# Patient Record
Sex: Female | Born: 1955
Health system: Southern US, Community
[De-identification: ages and names within clinical notes are randomized; demographics above are authoritative.]

## PROBLEM LIST (undated history)

## (undated) DIAGNOSIS — E7800 Pure hypercholesterolemia, unspecified: Secondary | ICD-10-CM

## (undated) DIAGNOSIS — R079 Chest pain, unspecified: Secondary | ICD-10-CM

## (undated) DIAGNOSIS — B019 Varicella without complication: Secondary | ICD-10-CM

## (undated) DIAGNOSIS — R002 Palpitations: Secondary | ICD-10-CM

## (undated) DIAGNOSIS — I10 Essential (primary) hypertension: Secondary | ICD-10-CM

## (undated) DIAGNOSIS — Z8619 Personal history of other infectious and parasitic diseases: Secondary | ICD-10-CM

## (undated) DIAGNOSIS — R51 Headache: Secondary | ICD-10-CM

## (undated) DIAGNOSIS — K219 Gastro-esophageal reflux disease without esophagitis: Secondary | ICD-10-CM

## (undated) DIAGNOSIS — R011 Cardiac murmur, unspecified: Secondary | ICD-10-CM

## (undated) HISTORY — DX: Personal history of other infectious and parasitic diseases: Z86.19

## (undated) HISTORY — DX: Gastro-esophageal reflux disease without esophagitis: K21.9

## (undated) HISTORY — DX: Headache: R51

## (undated) HISTORY — DX: Palpitations: R00.2

## (undated) HISTORY — DX: Varicella without complication: B01.9

## (undated) HISTORY — DX: Chest pain, unspecified: R07.9

## (undated) HISTORY — DX: Pure hypercholesterolemia, unspecified: E78.00

## (undated) HISTORY — PX: BREAST EXCISIONAL BIOPSY: SUR124

## (undated) HISTORY — DX: Cardiac murmur, unspecified: R01.1

## (undated) HISTORY — DX: Essential (primary) hypertension: I10

## (undated) HISTORY — PX: BIOPSY BREAST: PRO8

## (undated) HISTORY — PX: COLONOSCOPY: SHX174

---

## 2009-11-10 LAB — HM COLONOSCOPY: HM Colonoscopy: NORMAL

## 2012-06-08 LAB — HM PAP SMEAR: HM Pap smear: NORMAL

## 2012-08-21 ENCOUNTER — Ambulatory Visit (INDEPENDENT_AMBULATORY_CARE_PROVIDER_SITE_OTHER): Payer: BC Managed Care – PPO | Admitting: Internal Medicine

## 2012-08-21 ENCOUNTER — Encounter: Payer: Self-pay | Admitting: Internal Medicine

## 2012-08-21 VITALS — BP 142/90 | HR 60 | Temp 98.4°F | Ht 62.5 in | Wt 135.0 lb

## 2012-08-21 DIAGNOSIS — E785 Hyperlipidemia, unspecified: Secondary | ICD-10-CM

## 2012-08-21 DIAGNOSIS — I059 Rheumatic mitral valve disease, unspecified: Secondary | ICD-10-CM

## 2012-08-21 DIAGNOSIS — R519 Headache, unspecified: Secondary | ICD-10-CM | POA: Insufficient documentation

## 2012-08-21 DIAGNOSIS — R51 Headache: Secondary | ICD-10-CM

## 2012-08-21 DIAGNOSIS — I10 Essential (primary) hypertension: Secondary | ICD-10-CM

## 2012-08-21 DIAGNOSIS — Z8632 Personal history of gestational diabetes: Secondary | ICD-10-CM

## 2012-08-21 DIAGNOSIS — M542 Cervicalgia: Secondary | ICD-10-CM

## 2012-08-21 DIAGNOSIS — Z23 Encounter for immunization: Secondary | ICD-10-CM

## 2012-08-21 DIAGNOSIS — I341 Nonrheumatic mitral (valve) prolapse: Secondary | ICD-10-CM | POA: Insufficient documentation

## 2012-08-21 LAB — HEPATIC FUNCTION PANEL
ALT: 25 U/L (ref 0–35)
AST: 23 U/L (ref 0–37)
Albumin: 4.3 g/dL (ref 3.5–5.2)
Alkaline Phosphatase: 59 U/L (ref 39–117)

## 2012-08-21 LAB — LIPID PANEL
Cholesterol: 206 mg/dL — ABNORMAL HIGH (ref 0–200)
Total CHOL/HDL Ratio: 4
VLDL: 22.4 mg/dL (ref 0.0–40.0)

## 2012-08-21 LAB — BASIC METABOLIC PANEL
Calcium: 9.8 mg/dL (ref 8.4–10.5)
GFR: 94.5 mL/min (ref >60.00)
Potassium: 4.9 mEq/L (ref 3.5–5.1)
Sodium: 141 mEq/L (ref 135–145)

## 2012-08-21 LAB — CBC WITH DIFFERENTIAL/PLATELET
Eosinophils Relative: 1.8 % (ref 0.0–5.0)
HCT: 43.9 % (ref 36.0–46.0)
Hemoglobin: 14.4 g/dL (ref 12.0–15.0)
Lymphocytes Relative: 31 % (ref 12.0–46.0)
Lymphs Abs: 2 10*3/uL (ref 0.7–4.0)
Monocytes Relative: 5.8 % (ref 3.0–12.0)
Neutro Abs: 4 10*3/uL (ref 1.4–7.7)
RBC: 5.03 Mil/uL (ref 3.87–5.11)
WBC: 6.5 10*3/uL (ref 4.5–10.5)

## 2012-08-21 LAB — T4, FREE: Free T4: 0.77 ng/dL (ref 0.60–1.60)

## 2012-08-21 LAB — LDL CHOLESTEROL, DIRECT: Direct LDL: 145.4 mg/dL

## 2012-08-21 LAB — HEMOGLOBIN A1C: Hgb A1c MFr Bld: 6 % (ref 4.6–6.5)
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
## 2012-08-21 NOTE — Patient Instructions (Signed)
Will notify you  of labs when available.  Continue lifestyle intervention healthy eating and exercise . Flu vaccine today . Plan fu depdending on labs or preventive visit in 6 months .

## 2012-08-24 NOTE — Progress Notes (Signed)
Quick Note:  Pt informed on home personally identified VM ______ 

## 2012-12-06 ENCOUNTER — Ambulatory Visit: Payer: BC Managed Care – PPO | Admitting: Internal Medicine

## 2012-12-08 ENCOUNTER — Ambulatory Visit (INDEPENDENT_AMBULATORY_CARE_PROVIDER_SITE_OTHER): Payer: BC Managed Care – PPO | Admitting: Internal Medicine

## 2012-12-08 ENCOUNTER — Encounter: Payer: Self-pay | Admitting: Internal Medicine

## 2012-12-08 VITALS — BP 138/80 | HR 70 | Temp 98.4°F | Resp 18 | Wt 136.0 lb

## 2012-12-08 DIAGNOSIS — M549 Dorsalgia, unspecified: Secondary | ICD-10-CM

## 2012-12-08 LAB — POCT URINALYSIS DIP (MANUAL ENTRY)
Glucose, UA: NEGATIVE
Spec Grav, UA: 1.01
Urobilinogen, UA: 0.2
pH, UA: 6

## 2012-12-08 MED ORDER — METHOCARBAMOL 750 MG PO TABS: ORAL_TABLET | ORAL | Status: DC

## 2012-12-08 MED ORDER — NAPROXEN 500 MG PO TABS: 500.0000 mg | ORAL_TABLET | Freq: Two times a day (BID) | ORAL | Status: DC

## 2012-12-08 NOTE — Progress Notes (Signed)
Chief Complaint  Patient presents with  . Back Pain    HPI: Patient comes in today for SDA for  new problem evaluation. 2 weeks of back pain insidious onset  and went to chiro and  Temporarily helpul   .  Hx of same  off  But never lasted this long.  Pain can be 7/10 Worse with : ? Lifting    Avoiding lifting.  Heat helps.   Tried aleve the other niught ? Some help.  Worse with bending. Location lower back buttocks area without radiation to leg or weakness.  Has been driving and sitting a lot recently not a lot of exericse.  No bowel bladder issues and no fever. ROS: See pertinent positives and negatives per HPI.  Past Medical History  Diagnosis Date  . Chicken pox   . Headache   . Murmur, heart     MVP by hx   . BP (high blood pressure)   . High cholesterol   . Hx of varicella     Family History  Problem Relation Age of Onset  . Diabetes Mother   . Hypertension Mother   . Stroke Father   . Heart disease Paternal Grandmother     History   Social History  . Marital Status: Married    Spouse Name: N/A    Number of Children: N/A  . Years of Education: N/A   Social History Main Topics  . Smoking status: Never Smoker   . Smokeless tobacco: None  . Alcohol Use: No  . Drug Use: None  . Sexually Active: None   Other Topics Concern  . None   Social History Narrative   7 hours of sleep per night2 people living in the homeConsiders her health goodBA  Degree husband  Is minister No petsG3 P3 Neg ets  Net tad  Neg FAWalking q d     Outpatient Encounter Prescriptions as of 12/08/2012  Medication Sig Dispense Refill  . amLODipine (NORVASC) 2.5 MG tablet Take 2.5 mg by mouth daily.      . Cholecalciferol (VITAMIN D3) 2000 UNITS TABS Take 1 tablet by mouth daily.      . Evening Primrose Oil 1000 MG CAPS Take 1 capsule by mouth daily.      . nadolol (CORGARD) 20 MG tablet Take 10 mg by mouth daily.      . methocarbamol (ROBAXIN-750) 750 MG tablet Can use HS or tid prn muscle  spasm  24 tablet  0  . naproxen (NAPROSYN) 500 MG tablet Take 1 tablet (500 mg total) by mouth 2 (two) times daily with a meal.  30 tablet  1  . [DISCONTINUED] esomeprazole (NEXIUM) 40 MG capsule Take 40 mg by mouth daily before breakfast.        EXAM:  BP 138/80  Pulse 70  Temp 98.4 F (36.9 C) (Oral)  Resp 18  Wt 136 lb (61.689 kg)  SpO2 94%  There is no height on file to calculate BMI.  GENERAL: vitals reviewed and listed above, alert, oriented, appears well hydrated and in no acute distress  HEENT: atraumatic, conjunctiva  clear, no obvious abnormalities on inspection of external nose and ears  NECK: no obvious masses on inspection palpation  Abdomen:  Sof,t normal bowel sounds without hepatosplenomegaly, no guarding rebound or masses no CVA tenderness CV: HRRR, no clubbing cyanosis or  peripheral edema nl cap refill  MS: moves all extremities without noticeable focal  Abnormality Back no scoliosis no point tenderness midline  points to upper SI areas  And such neg slr toe heel walk nl  Sitting more uncomfortable .  PSYCH: pleasant and cooperative, no obvious depression or anxiety UA  Basically normal trc bl insig  ASSESSMENT AND PLAN:  Discussed the following assessment and plan:  1. Back pain low  POCT urinalysis dipstick   low insidious prob mechanichal bit a bit atypical will follow    Risk benefit of medication discussed. nsaid for 7- 10 days  Back hygiene and fu if  persistent or progressive . after 2 weeks or  If worse.avoid prolonged sitting  -Patient advised to return or notify health care team  if symptoms worsen or persist or new concerns arise.  Patient Instructions  This seems like mechanical back pain    And poss sciatica pain. Sometimes sitting loing times and lifting can aggravate this.   Add  Antiinflammatory  For 7 -10 days  . Can use mild muscle relaxant at night if needed.    I f not getting better in the next 2 weeks contact us for FU  .      Back Pain, Adult Low back pain is very common. About 1 in 5 people have back pain.The cause of low back pain is rarely dangerous. The pain often gets better over time.About half of people with a sudden onset of back pain feel better in just 2 weeks. About 8 in 10 people feel better by 6 weeks.  CAUSES Some common causes of back pain include:  Strain of the muscles or ligaments supporting the spine.  Wear and tear (degeneration) of the spinal discs.  Arthritis.  Direct injury to the back. DIAGNOSIS Most of the time, the direct cause of low back pain is not known.However, back pain can be treated effectively even when the exact cause of the pain is unknown.Answering your caregiver's questions about your overall health and symptoms is one of the most accurate ways to make sure the cause of your pain is not dangerous. If your caregiver needs more information, he or she may order lab work or imaging tests (X-rays or MRIs).However, even if imaging tests show changes in your back, this usually does not require surgery. HOME CARE INSTRUCTIONS For many people, back pain returns.Since low back pain is rarely dangerous, it is often a condition that people can learn to [facility] their own.   Remain active. It is stressful on the back to sit or stand in one place. Do not sit, drive, or stand in one place for more than 30 minutes at a time. Take short walks on level surfaces as soon as pain allows.Try to increase the length of time you walk each day.  Do not stay in bed.Resting more than 1 or 2 days can delay your recovery.  Do not avoid exercise or work.Your body is made to move.It is not dangerous to be active, even though your back may hurt.Your back will likely heal faster if you return to being active before your pain is gone.  Pay attention to your body when you bend and lift. Many people have less discomfortwhen lifting if they bend their knees, keep the load close to their  bodies,and avoid twisting. Often, the most comfortable positions are those that put less stress on your recovering back.  Find a comfortable position to sleep. Use a firm mattress and lie on your side with your knees slightly bent. If you lie on your back, put a pillow under your knees.  Only take  over-the-counter or prescription medicines as directed by your caregiver. Over-the-counter medicines to reduce pain and inflammation are often the most helpful.Your caregiver may prescribe muscle relaxant drugs.These medicines help dull your pain so you can more quickly return to your normal activities and healthy exercise.  Put ice on the injured area.  Put ice in a plastic bag.  Place a towel between your skin and the bag.  Leave the ice on for 15 to 20 minutes, 3 to 4 times a day for the first 2 to 3 days. After that, ice and heat may be alternated to reduce pain and spasms.  Ask your caregiver about trying back exercises and gentle massage. This may be of some benefit.  Avoid feeling anxious or stressed.Stress increases muscle tension and can worsen back pain.It is important to recognize when you are anxious or stressed and learn ways to manage it.Exercise is a great option. SEEK MEDICAL CARE IF:  You have pain that is not relieved with rest or medicine.  You have pain that does not improve in 1 week.  You have new symptoms.  You are generally not feeling well. SEEK IMMEDIATE MEDICAL CARE IF:   You have pain that radiates from your back into your legs.  You develop new bowel or bladder control problems.  You have unusual weakness or numbness in your arms or legs.  You develop nausea or vomiting.  You develop abdominal pain.  You feel faint. Document Released: 10/25/2005 Document Revised: 04/25/2012 Document Reviewed: 03/15/2011 [facility] Patient Information 2013 [place], [place].  Back Exercises These exercises may help you when beginning to rehabilitate your injury. Your  symptoms may resolve with or without further involvement from your physician, physical therapist or athletic trainer. While completing these exercises, remember:   Restoring tissue flexibility helps normal motion to return to the joints. This allows healthier, less painful movement and activity.  An effective stretch should be held for at least 30 seconds.  A stretch should never be painful. You should only feel a gentle lengthening or release in the stretched tissue. STRETCH  Extension, Prone on Elbows   Lie on your stomach on the floor, a bed will be too soft. Place your palms about shoulder width apart and at the height of your head.  Place your elbows under your shoulders. If this is too painful, stack pillows under your chest.  Allow your body to relax so that your hips drop lower and make contact more completely with the floor.  Hold this position for __________ seconds.  Slowly return to lying flat on the floor. Repeat __________ times. Complete this exercise __________ times per day.  RANGE OF MOTION  Extension, Prone Press Ups   Lie on your stomach on the floor, a bed will be too soft. Place your palms about shoulder width apart and at the height of your head.  Keeping your back as relaxed as possible, slowly straighten your elbows while keeping your hips on the floor. You may adjust the placement of your hands to maximize your comfort. As you gain motion, your hands will come more underneath your shoulders.  Hold this position __________ seconds.  Slowly return to lying flat on the floor. Repeat __________ times. Complete this exercise __________ times per day.  RANGE OF MOTION- Quadruped, Neutral Spine   Assume a hands and knees position on a firm surface. Keep your hands under your shoulders and your knees under your hips. You may place padding under your knees for comfort.  Drop  your head and point your tail bone toward the ground below you. This will round out your low  back like an angry cat. Hold this position for __________ seconds.  Slowly lift your head and release your tail bone so that your back sags into a large arch, like an old horse.  Hold this position for __________ seconds.  Repeat this until you feel limber in your low back.  Now, find your "sweet spot." This will be the most comfortable position somewhere between the two previous positions. This is your neutral spine. Once you have found this position, tense your stomach muscles to support your low back.  Hold this position for __________ seconds. Repeat __________ times. Complete this exercise __________ times per day.  STRETCH  Flexion, Single Knee to Chest   Lie on a firm bed or floor with both legs extended in front of you.  Keeping one leg in contact with the floor, bring your opposite knee to your chest. Hold your leg in place by either grabbing behind your thigh or at your knee.  Pull until you feel a gentle stretch in your low back. Hold __________ seconds.  Slowly release your grasp and repeat the exercise with the opposite side. Repeat __________ times. Complete this exercise __________ times per day.  STRETCH - Hamstrings, Standing  Stand or sit and extend your right / left leg, placing your foot on a chair or foot stool  Keeping a slight arch in your low back and your hips straight forward.  Lead with your chest and lean forward at the waist until you feel a gentle stretch in the back of your right / left knee or thigh. (When done correctly, this exercise requires leaning only a small distance.)  Hold this position for __________ seconds. Repeat __________ times. Complete this stretch __________ times per day. STRENGTHENING  Deep Abdominals, Pelvic Tilt   Lie on a firm bed or floor. Keeping your legs in front of you, bend your knees so they are both pointed toward the ceiling and your feet are flat on the floor.  Tense your lower abdominal muscles to press your low  back into the floor. This motion will rotate your pelvis so that your tail bone is scooping upwards rather than pointing at your feet or into the floor.  With a gentle tension and even breathing, hold this position for __________ seconds. Repeat __________ times. Complete this exercise __________ times per day.  STRENGTHENING  Abdominals, Crunches   Lie on a firm bed or floor. Keeping your legs in front of you, bend your knees so they are both pointed toward the ceiling and your feet are flat on the floor. Cross your arms over your chest.  Slightly tip your chin down without bending your neck.  Tense your abdominals and slowly lift your trunk high enough to just clear your shoulder blades. Lifting higher can put excessive stress on the low back and does not further strengthen your abdominal muscles.  Control your return to the starting position. Repeat __________ times. Complete this exercise __________ times per day.  STRENGTHENING  Quadruped, Opposite UE/LE Lift   Assume a hands and knees position on a firm surface. Keep your hands under your shoulders and your knees under your hips. You may place padding under your knees for comfort.  Find your neutral spine and gently tense your abdominal muscles so that you can maintain this position. Your shoulders and hips should form a rectangle that is parallel with the  floor and is not twisted.  Keeping your trunk steady, lift your right hand no higher than your shoulder and then your left leg no higher than your hip. Make sure you are not holding your breath. Hold this position __________ seconds.  Continuing to keep your abdominal muscles tense and your back steady, slowly return to your starting position. Repeat with the opposite arm and leg. Repeat __________ times. Complete this exercise __________ times per day. Document Released: 11/12/2005 Document Revised: 01/17/2012 Document Reviewed: 02/06/2009 [facility] Patient Information 2013  [place], [place].       Neta Mends. Beryle Bagsby M.D.

## 2012-12-08 NOTE — Patient Instructions (Addendum)
This seems like mechanical back pain    And poss sciatica pain. Sometimes sitting loing times and lifting can aggravate this.   Add  Antiinflammatory  For 7 -10 days  . Can use mild muscle relaxant at night if needed.    I f not getting better in the next 2 weeks contact us for FU .      Back Pain, Adult Low back pain is very common. About 1 in 5 people have back pain.The cause of low back pain is rarely dangerous. The pain often gets better over time.About half of people with a sudden onset of back pain feel better in just 2 weeks. About 8 in 10 people feel better by 6 weeks.  CAUSES Some common causes of back pain include:  Strain of the muscles or ligaments supporting the spine.  Wear and tear (degeneration) of the spinal discs.  Arthritis.  Direct injury to the back. DIAGNOSIS Most of the time, the direct cause of low back pain is not known.However, back pain can be treated effectively even when the exact cause of the pain is unknown.Answering your caregiver's questions about your overall health and symptoms is one of the most accurate ways to make sure the cause of your pain is not dangerous. If your caregiver needs more information, he or she may order lab work or imaging tests (X-rays or MRIs).However, even if imaging tests show changes in your back, this usually does not require surgery. HOME CARE INSTRUCTIONS For many people, back pain returns.Since low back pain is rarely dangerous, it is often a condition that people can learn to [facility] their own.   Remain active. It is stressful on the back to sit or stand in one place. Do not sit, drive, or stand in one place for more than 30 minutes at a time. Take short walks on level surfaces as soon as pain allows.Try to increase the length of time you walk each day.  Do not stay in bed.Resting more than 1 or 2 days can delay your recovery.  Do not avoid exercise or work.Your body is made to move.It is not dangerous to be  active, even though your back may hurt.Your back will likely heal faster if you return to being active before your pain is gone.  Pay attention to your body when you bend and lift. Many people have less discomfortwhen lifting if they bend their knees, keep the load close to their bodies,and avoid twisting. Often, the most comfortable positions are those that put less stress on your recovering back.  Find a comfortable position to sleep. Use a firm mattress and lie on your side with your knees slightly bent. If you lie on your back, put a pillow under your knees.  Only take over-the-counter or prescription medicines as directed by your caregiver. Over-the-counter medicines to reduce pain and inflammation are often the most helpful.Your caregiver may prescribe muscle relaxant drugs.These medicines help dull your pain so you can more quickly return to your normal activities and healthy exercise.  Put ice on the injured area.  Put ice in a plastic bag.  Place a towel between your skin and the bag.  Leave the ice on for 15 to 20 minutes, 3 to 4 times a day for the first 2 to 3 days. After that, ice and heat may be alternated to reduce pain and spasms.  Ask your caregiver about trying back exercises and gentle massage. This may be of some benefit.  Avoid feeling anxious  or stressed.Stress increases muscle tension and can worsen back pain.It is important to recognize when you are anxious or stressed and learn ways to manage it.Exercise is a great option. SEEK MEDICAL CARE IF:  You have pain that is not relieved with rest or medicine.  You have pain that does not improve in 1 week.  You have new symptoms.  You are generally not feeling well. SEEK IMMEDIATE MEDICAL CARE IF:   You have pain that radiates from your back into your legs.  You develop new bowel or bladder control problems.  You have unusual weakness or numbness in your arms or legs.  You develop nausea or  vomiting.  You develop abdominal pain.  You feel faint. Document Released: 10/25/2005 Document Revised: 04/25/2012 Document Reviewed: 03/15/2011 [facility] Patient Information 2013 [place], [place].  Back Exercises These exercises may help you when beginning to rehabilitate your injury. Your symptoms may resolve with or without further involvement from your physician, physical therapist or athletic trainer. While completing these exercises, remember:   Restoring tissue flexibility helps normal motion to return to the joints. This allows healthier, less painful movement and activity.  An effective stretch should be held for at least 30 seconds.  A stretch should never be painful. You should only feel a gentle lengthening or release in the stretched tissue. STRETCH  Extension, Prone on Elbows   Lie on your stomach on the floor, a bed will be too soft. Place your palms about shoulder width apart and at the height of your head.  Place your elbows under your shoulders. If this is too painful, stack pillows under your chest.  Allow your body to relax so that your hips drop lower and make contact more completely with the floor.  Hold this position for __________ seconds.  Slowly return to lying flat on the floor. Repeat __________ times. Complete this exercise __________ times per day.  RANGE OF MOTION  Extension, Prone Press Ups   Lie on your stomach on the floor, a bed will be too soft. Place your palms about shoulder width apart and at the height of your head.  Keeping your back as relaxed as possible, slowly straighten your elbows while keeping your hips on the floor. You may adjust the placement of your hands to maximize your comfort. As you gain motion, your hands will come more underneath your shoulders.  Hold this position __________ seconds.  Slowly return to lying flat on the floor. Repeat __________ times. Complete this exercise __________ times per day.  RANGE OF MOTION-  Quadruped, Neutral Spine   Assume a hands and knees position on a firm surface. Keep your hands under your shoulders and your knees under your hips. You may place padding under your knees for comfort.  Drop your head and point your tail bone toward the ground below you. This will round out your low back like an angry cat. Hold this position for __________ seconds.  Slowly lift your head and release your tail bone so that your back sags into a large arch, like an old horse.  Hold this position for __________ seconds.  Repeat this until you feel limber in your low back.  Now, find your "sweet spot." This will be the most comfortable position somewhere between the two previous positions. This is your neutral spine. Once you have found this position, tense your stomach muscles to support your low back.  Hold this position for __________ seconds. Repeat __________ times. Complete this exercise __________ times per  day.  STRETCH  Flexion, Single Knee to Chest   Lie on a firm bed or floor with both legs extended in front of you.  Keeping one leg in contact with the floor, bring your opposite knee to your chest. Hold your leg in place by either grabbing behind your thigh or at your knee.  Pull until you feel a gentle stretch in your low back. Hold __________ seconds.  Slowly release your grasp and repeat the exercise with the opposite side. Repeat __________ times. Complete this exercise __________ times per day.  STRETCH - Hamstrings, Standing  Stand or sit and extend your right / left leg, placing your foot on a chair or foot stool  Keeping a slight arch in your low back and your hips straight forward.  Lead with your chest and lean forward at the waist until you feel a gentle stretch in the back of your right / left knee or thigh. (When done correctly, this exercise requires leaning only a small distance.)  Hold this position for __________ seconds. Repeat __________ times. Complete  this stretch __________ times per day. STRENGTHENING  Deep Abdominals, Pelvic Tilt   Lie on a firm bed or floor. Keeping your legs in front of you, bend your knees so they are both pointed toward the ceiling and your feet are flat on the floor.  Tense your lower abdominal muscles to press your low back into the floor. This motion will rotate your pelvis so that your tail bone is scooping upwards rather than pointing at your feet or into the floor.  With a gentle tension and even breathing, hold this position for __________ seconds. Repeat __________ times. Complete this exercise __________ times per day.  STRENGTHENING  Abdominals, Crunches   Lie on a firm bed or floor. Keeping your legs in front of you, bend your knees so they are both pointed toward the ceiling and your feet are flat on the floor. Cross your arms over your chest.  Slightly tip your chin down without bending your neck.  Tense your abdominals and slowly lift your trunk high enough to just clear your shoulder blades. Lifting higher can put excessive stress on the low back and does not further strengthen your abdominal muscles.  Control your return to the starting position. Repeat __________ times. Complete this exercise __________ times per day.  STRENGTHENING  Quadruped, Opposite UE/LE Lift   Assume a hands and knees position on a firm surface. Keep your hands under your shoulders and your knees under your hips. You may place padding under your knees for comfort.  Find your neutral spine and gently tense your abdominal muscles so that you can maintain this position. Your shoulders and hips should form a rectangle that is parallel with the floor and is not twisted.  Keeping your trunk steady, lift your right hand no higher than your shoulder and then your left leg no higher than your hip. Make sure you are not holding your breath. Hold this position __________ seconds.  Continuing to keep your abdominal muscles tense and  your back steady, slowly return to your starting position. Repeat with the opposite arm and leg. Repeat __________ times. Complete this exercise __________ times per day. Document Released: 11/12/2005 Document Revised: 01/17/2012 Document Reviewed: 02/06/2009 [facility] Patient Information 2013 [place], [place].

## 2012-12-09 ENCOUNTER — Encounter: Payer: Self-pay | Admitting: Internal Medicine

## 2013-02-19 ENCOUNTER — Ambulatory Visit: Payer: BC Managed Care – PPO | Admitting: Internal Medicine

## 2013-02-27 ENCOUNTER — Ambulatory Visit (INDEPENDENT_AMBULATORY_CARE_PROVIDER_SITE_OTHER): Payer: BC Managed Care – PPO | Admitting: Internal Medicine

## 2013-02-27 ENCOUNTER — Encounter: Payer: Self-pay | Admitting: Internal Medicine

## 2013-02-27 VITALS — BP 138/86 | HR 68 | Temp 98.1°F | Ht 62.5 in | Wt 138.0 lb

## 2013-02-27 DIAGNOSIS — I059 Rheumatic mitral valve disease, unspecified: Secondary | ICD-10-CM

## 2013-02-27 DIAGNOSIS — I341 Nonrheumatic mitral (valve) prolapse: Secondary | ICD-10-CM

## 2013-02-27 DIAGNOSIS — R739 Hyperglycemia, unspecified: Secondary | ICD-10-CM | POA: Insufficient documentation

## 2013-02-27 DIAGNOSIS — Z0000 Encounter for general adult medical examination without abnormal findings: Secondary | ICD-10-CM

## 2013-02-27 DIAGNOSIS — R7309 Other abnormal glucose: Secondary | ICD-10-CM

## 2013-02-27 DIAGNOSIS — E785 Hyperlipidemia, unspecified: Secondary | ICD-10-CM

## 2013-02-27 DIAGNOSIS — Z8632 Personal history of gestational diabetes: Secondary | ICD-10-CM

## 2013-02-27 DIAGNOSIS — I10 Essential (primary) hypertension: Secondary | ICD-10-CM

## 2013-02-27 NOTE — Progress Notes (Signed)
Chief Complaint  Patient presents with  . Annual Exam    HPI: Patient comes in today for Preventive Health Care visit  No major changes in her health since her last visit. Recently came back from [place]. Tries to exercise on regular basis and no physical limitations at this time. No major changes in hearing vision or any neurologic symptoms. Uncertain when she had her last tetanus shot has a call into her previous health care team. States that she is up-to-date on her colonoscopy Pap smear and mammogram. Denies depression on screening questions.  ROS:  GEN/ HEENT: No fever, significant weight changes sweats headaches vision problems hearing changes, CV/ PULM; No chest pain shortness of breath cough, syncope,edema  change in exercise tolerance. GI /GU: No adominal pain, vomiting, change in bowel habits. No blood in the stool. No significant GU symptoms. SKIN/HEME: ,no acute skin rashes suspicious lesions or bleeding. No lymphadenopathy, nodules, masses.  NEURO/ PSYCH:  No neurologic signs such as weakness numbness. No depression anxiety. IMM/ Allergy: No unusual infections.  Allergy .   REST of 12 system review negative except as per HPI   Past Medical History  Diagnosis Date  . Chicken pox   . Headache   . Murmur, heart     MVP by hx   . BP (high blood pressure)   . High cholesterol   . Hx of varicella     Family History  Problem Relation Age of Onset  . Diabetes Mother   . Hypertension Mother   . Stroke Father   . Heart disease Paternal Grandmother    Past Surgical History  Procedure Laterality Date  . Cesarean section  [id number]  . Biopsy breast      History   Social History  . Marital Status: Married    Spouse Name: N/A    Number of Children: N/A  . Years of Education: N/A   Social History Main Topics  . Smoking status: Never Smoker   . Smokeless tobacco: None  . Alcohol Use: No  . Drug Use: None  . Sexually Active: None   Other Topics Concern  .  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              Outpatient Encounter Prescriptions as of 02/27/2013  Medication Sig Dispense Refill  . amLODipine (NORVASC) 2.5 MG tablet Take 2.5 mg by mouth daily.      . Cholecalciferol (VITAMIN D3) 2000 UNITS TABS Take 1 tablet by mouth daily.      . nadolol (CORGARD) 20 MG tablet Take 10 mg by mouth daily.      . [DISCONTINUED] Evening Primrose Oil 1000 MG CAPS Take 1 capsule by mouth daily.      . [DISCONTINUED] methocarbamol (ROBAXIN-750) 750 MG tablet Can use HS or tid prn muscle spasm  24 tablet  0  . [DISCONTINUED] naproxen (NAPROSYN) 500 MG tablet Take 1 tablet (500 mg total) by mouth 2 (two) times daily with a meal.  30 tablet  1   No facility-administered encounter medications on file as of 02/27/2013.    EXAM:  BP  138/86  Pulse 68  Temp(Src) 98.1 F (36.7 C) (Oral)  Ht 5' 2.5" (1.588 m)  Wt 138 lb (62.596 kg)  BMI 24.82 kg/m2  SpO2 98%  Body mass index is 24.82 kg/(m^2).  Physical Exam: Vital signs reviewed ZOX:WRUE is a well-developed well-nourished alert cooperative   female who appears her stated age in no acute distress.  HEENT: normocephalic atraumatic , Eyes: PERRL EOM's full, conjunctiva clear, Nares: paten,t no deformity discharge or tenderness., Ears: no deformity EAC's clear TMs with normal landmarks. Mouth: clear OP, no lesions, edema.  Moist mucous membranes. Dentition in adequate repair. NECK: supple without masses, thyromegaly or bruits. CHEST/PULM:  Clear to auscultation and percussion breath sounds equal no wheeze , rales or rhonchi. No chest wall deformities or tenderness. Breasts no nodules discharge or dimpling well-healed scar on the right breast. Axilla is clear CV: PMI is nondisplaced, S1  S2 no gallops, murmurs, rubs. There is a midsystolic click that is easily heard in most positions with no murmur Peripheral pulses are full without delay.No JVD .  ABDOMEN: Bowel sounds normal nontender  No guard or rebound, no hepato splenomegal no CVA tenderness.  No hernia. Extremtities:  No clubbing cyanosis or edema, no acute joint swelling or redness no focal atrophy NEURO:  Oriented x3, cranial nerves 3-12 appear to be intact, no obvious focal weakness,gait within normal limits no abnormal reflexes or asymmetrical SKIN: No acute rashes normal turgor, color, no bruising or petechiae. PSYCH: Oriented, good eye contact, no obvious depression anxiety, cognition and judgment appear normal. LN: no cervical axillary inguinal adenopathy  Lab Results  Component Value Date   WBC 6.5 08/21/2012   HGB 14.4 08/21/2012   HCT 43.9 08/21/2012   PLT 295.0 08/21/2012   GLUCOSE 104* 08/21/2012   CHOL 206* 08/21/2012   TRIG 112.0 08/21/2012   HDL 53.80 08/21/2012   LDLDIRECT 145.4 08/21/2012   ALT 25 08/21/2012   AST 23 08/21/2012   NA 141 08/21/2012   K 4.9 08/21/2012   CL 104 08/21/2012   CREATININE 0.8 08/21/2012   BUN 11 08/21/2012   CO2 30 08/21/2012   TSH 1.39 08/21/2012   HGBA1C 6.0 08/21/2012    ASSESSMENT AND PLAN:  Discussed the following assessment and plan:  Visit for preventive health examination - Appears to be up-to-date except for unknown tetanus status will let us know documentation when available  Hypertension - Borderline elevated today monitor at home he turned if elevated with monitor otherwise in 6 months  MVP (mitral valve prolapse) - No symptoms no murmur heard  Hx gestational diabetes  Other and unspecified hyperlipidemia  Hyperglycemia - borderline up a1c is 6  lsi and monitor yearly at least.  Patient Care Team: Madelin Headings, MD as PCP - General (Internal Medicine) Juluis Mire, MD (Obstetrics and Gynecology) Nathanial Rancher as Physician Assistant  (Chiropractic Medicine) Patient Instructions  Your blood pressure reading is borderline today this could just be from the office visit.  Please checked her readings at home to ensure that they are mostly below 140/90.  If your readings are not controlled make an office visit and bring her monitor in to the visit that we can correlate our readings and make decisions about your blood pressure.  DASH diet is a very healthy diet to help control blood pressure.  Continue healthy lifestyle.  Blood sugars are borderline any early prediabetic phase which may never be a problem but would avoid simple sugars and sweets and continue to exercise.  Try Debrox or Murine airway softening drops into the left ear at night for about a week.   If you're still having a problem with feeling clogged from ear wax make an appointment and we can consider ear wax removal flushing etc.   Can contact our office and come in for a tetanus booster if you were due.   ROV in 6 months labs pre visit If we're doing well we can go to yearly checks.    Neta Mends. Isael Stille M.D. Health Maintenance  Topic Date Due  . Tetanus/tdap  11/21/1976  . Influenza Vaccine  07/09/2013  . Mammogram  06/08/2014  . Pap Smear  06/09/2015  . Colonoscopy  11/11/2019   Health Maintenance Review

## 2013-02-27 NOTE — Patient Instructions (Signed)
Your blood pressure reading is borderline today this could just be from the office visit.  Please checked her readings at home to ensure that they are mostly below 140/90.  If your readings are not controlled make an office visit and bring her monitor in to the visit that we can correlate our readings and make decisions about your blood pressure.  DASH diet is a very healthy diet to help control blood pressure.  Continue healthy lifestyle.  Blood sugars are borderline any early prediabetic phase which may never be a problem but would avoid simple sugars and sweets and continue to exercise.  Try Debrox or Murine airway softening drops into the left ear at night for about a week.   If you're still having a problem with feeling clogged from ear wax make an appointment and we can consider ear wax removal flushing etc.   Can contact our office and come in for a tetanus booster if you were due.   ROV in 6 months labs pre visit If we're doing well we can go to yearly checks.

## 2013-07-24 ENCOUNTER — Encounter: Payer: Self-pay | Admitting: Internal Medicine

## 2013-07-24 ENCOUNTER — Ambulatory Visit (INDEPENDENT_AMBULATORY_CARE_PROVIDER_SITE_OTHER): Payer: BC Managed Care – PPO | Admitting: Internal Medicine

## 2013-07-24 VITALS — BP 164/90 | HR 89 | Temp 98.5°F | Wt 137.0 lb

## 2013-07-24 DIAGNOSIS — R51 Headache: Secondary | ICD-10-CM

## 2013-07-24 DIAGNOSIS — I10 Essential (primary) hypertension: Secondary | ICD-10-CM

## 2013-07-24 DIAGNOSIS — J329 Chronic sinusitis, unspecified: Secondary | ICD-10-CM

## 2013-07-24 MED ORDER — FLUTICASONE PROPIONATE 50 MCG/ACT NA SUSP: NASAL | Status: DC

## 2013-07-24 MED ORDER — AMOXICILLIN-POT CLAVULANATE 875-125 MG PO TABS: 1.0000 | ORAL_TABLET | Freq: Two times a day (BID) | ORAL | Status: DC

## 2013-07-24 NOTE — Progress Notes (Signed)
Chief Complaint  Patient presents with  . Headache    Ongoing for 2 weeks.    HPI: Patient comes in today for SDA for  new problem evaluation. Has a history of headaches in the past but this is a bit different. Insidious onset of 2 weeks of ha  And comes and goes never goes away Becomes bearable and now light headed and dizzy . Located around the face and ethmoid frontal region around the eyes  Treatment    Either advil or  Allergy piull  Diphenhydramine   ? Some sinue  Eyes and has.  No fever   Some congestion at ethmoid. Worse when bending.  Cough last week. Not currently.  Bp   taking medications daily And  Rapid heart rate stable on meds .  Days ago her systolic blood pressure was t 161  .  Brings in machine to visit today to correlate   advil   2 last pm. None today. Not on the decongestant ROS: See pertinent positives and negatives per HPI. No current chest pain shortness of breath syncope. No vision changes or neurologic symptoms no trauma   Past Medical History  Diagnosis Date  . Chicken pox   . Headache(784.0)   . Murmur, heart     MVP by hx   . BP (high blood pressure)   . High cholesterol   . Hx of varicella     Family History  Problem Relation Age of Onset  . Diabetes Mother   . Hypertension Mother   . Stroke Father   . Heart disease Paternal Grandmother     History   Social History  . Marital Status: Married    Spouse Name: N/A    Number of Children: N/A  . Years of Education: N/A   Social History Main Topics  . Smoking status: Never Smoker   . Smokeless tobacco: None  . Alcohol Use: No  . Drug Use: None  . Sexual Activity: None   Other Topics Concern  . 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              Outpatient  Encounter Prescriptions as of 07/24/2013  Medication Sig Dispense Refill  . amLODipine (NORVASC) 2.5 MG tablet Take 2.5 mg by mouth daily.      . Cholecalciferol (VITAMIN D3) 2000 UNITS TABS Take 1 tablet by mouth daily.      . nadolol (CORGARD) 20 MG tablet Take 10 mg by mouth daily.      Marland Kitchen amoxicillin-clavulanate (AUGMENTIN) 875-125 MG per tablet Take 1 tablet by mouth 2 (two) times daily. For sinusitis  20 tablet  0  . fluticasone (FLONASE) 50 MCG/ACT nasal spray 2 spray each nostril qd  16 g  3   No facility-administered encounter medications on file as of 07/24/2013.    EXAM:  BP 164/90  Pulse 89  Temp(Src) 98.5 F (36.9 C) (Oral)  Wt 137 lb (62.143 kg)  BMI 24.64 kg/m2  SpO2 97%  Body mass index is 24.64 kg/(m^2). bp with  her monitor 167/97 left sitting  Confirmed  GENERAL: vitals reviewed and  listed above, alert, oriented, appears well hydrated and in no acute distress looks allergic   Congestion   Non toxic .  HEENT: atraumatic, conjunctiva  clear, no obvious abnormalities on inspection of external nose and ears nares slight congestion some tenderness ethmoid frontal but no edema OP : no lesion edema or exudate  NECK: no obvious masses on inspection palpation no adenopathy supple LUNGS: clear to auscultation bilaterally, no wheezes, rales or rhonchi, good air movement CV: HRRR, no clubbing cyanosis or  peripheral edema nl cap refill there is a short systolic ejection murmur left upper sternal border not radiating into the neck MS: moves all extremities without noticeable focal  abnormality or logic are grossly intact PSYCH: pleasant and cooperative, no obvious depression or anxiety Patient's blood pressure reading left arm 167/97 my reading right arm sitting office cuff 164/90. ASSESSMENT AND PLAN:  Discussed the following assessment and plan:  Headache  Hypertension  Sinusitis Uncertain cause of headache but nonfocal benign exam seems to be sinus upper respiratory  congestion related a bit atypical for other blood pressure has seemingly been controlled and perhaps it is elevated because of pain and Advil today. We'll treat empirically for sinusitis Flonase and antibiotic have her monitor her blood pressure readings and increase the dose of Norvasc if continuing elevated. Avoid decongestants at this time.   Follow up in about a month she has a prescheduled checkup in October. But she should contact us if not resolving in the meantime. -Patient advised to return or notify health care team  if symptoms worsen or persist or new concerns arise.  Patient Instructions  Suspect a sinus infection or sinus related headache with congestion . Begin  Nasal cortisone every day to decrease congestion. Add antibiotic may help  In some cases.  bp  Control  :   Check readings   At home and if 150 and above  Then increase amlodipine to 5 mg a day or take 2 of the 2.5 mg Tylenol is safe for for blood pressure control bleeding may not work quite as well for pain he can use warm compresses also. Expect improvement within the next 3-5 days. Contact us in followup if not improving within the next week or so. Or as needed. If you increase amlodipine to 5 mg a day plan followup visit in about a month. You can sign up for my chart and send [place] readings are messages with questions or concerns.  Sinusitis Sinusitis is redness, soreness, and swelling (inflammation) of the paranasal sinuses. Paranasal sinuses are air pockets within the bones of your face (beneath the eyes, the middle of the forehead, or above the eyes). In healthy paranasal sinuses, mucus is able to drain out, and air is able to circulate through them by way of your nose. However, when your paranasal sinuses are inflamed, mucus and air can become trapped. This can allow bacteria and other germs to grow and cause infection. Sinusitis can develop quickly and last only a short time (acute) or continue over a long period  (chronic). Sinusitis that lasts for more than 12 weeks is considered chronic.  CAUSES  Causes of sinusitis include:  Allergies.  Structural abnormalities, such as displacement of the cartilage that separates your nostrils (deviated septum), which can decrease the air flow through your nose and sinuses and affect sinus drainage.  Functional abnormalities, such as when the small hairs (cilia) that line your sinuses and help remove mucus do not work properly or are not present. SYMPTOMS  Symptoms of acute and chronic sinusitis are the same. The primary symptoms are pain and pressure around the affected sinuses. Other symptoms include:  Upper toothache.  Earache.  Headache.  Bad breath.  Decreased sense of smell and taste.  A cough, which worsens when you are lying flat.  Fatigue.  Fever.  Thick drainage from your nose, which often is green and may contain pus (purulent).  Swelling and warmth over the affected sinuses. DIAGNOSIS  Your caregiver will perform a physical exam. During the exam, your caregiver may:  Look in your nose for signs of abnormal growths in your nostrils (nasal polyps).  Tap over the affected sinus to check for signs of infection.  View the inside of your sinuses (endoscopy) with a special imaging device with a light attached (endoscope), which is inserted into your sinuses. If your caregiver suspects that you have chronic sinusitis, one or more of the following tests may be recommended:  Allergy tests.  Nasal culture A sample of mucus is taken from your nose and sent to a lab and screened for bacteria.  Nasal cytology A sample of mucus is taken from your nose and examined by your caregiver to determine if your sinusitis is related to an allergy. TREATMENT  Most cases of acute sinusitis are related to a viral infection and will resolve on their own within 10 days. Sometimes medicines are prescribed to help relieve symptoms (pain medicine, decongestants,  nasal steroid sprays, or saline sprays).  However, for sinusitis related to a bacterial infection, your caregiver will prescribe antibiotic medicines. These are medicines that will help kill the bacteria causing the infection.  Rarely, sinusitis is caused by a fungal infection. In theses cases, your caregiver will prescribe antifungal medicine. For some cases of chronic sinusitis, surgery is needed. Generally, these are cases in which sinusitis recurs more than 3 times per year, despite other treatments. HOME CARE INSTRUCTIONS   Drink plenty of water. Water helps thin the mucus so your sinuses can drain more easily.  Use a humidifier.  Inhale steam 3 to 4 times a day (for example, sit in the bathroom with the shower running).  Apply a warm, moist washcloth to your face 3 to 4 times a day, or as directed by your caregiver.  Use saline nasal sprays to help moisten and clean your sinuses.  Take over-the-counter or prescription medicines for pain, discomfort, or fever only as directed by your caregiver. SEEK IMMEDIATE MEDICAL CARE IF:  You have increasing pain or severe headaches.  You have nausea, vomiting, or drowsiness.  You have swelling around your face.  You have vision problems.  You have a stiff neck.  You have difficulty breathing. MAKE SURE YOU:   Understand these instructions.  Will watch your condition.  Will get help right away if you are not doing well or get worse. Document Released: 10/25/2005 Document Revised: 01/17/2012 Document Reviewed: 11/09/2011 [facility] Patient Information 2014 [place], [place].         Neta Mends. Panosh M.D.

## 2013-07-24 NOTE — Patient Instructions (Addendum)
Suspect a sinus infection or sinus related headache with congestion . Begin  Nasal cortisone every day to decrease congestion. Add antibiotic may help  In some cases.  bp  Control  :   Check readings   At home and if 150 and above  Then increase amlodipine to 5 mg a day or take 2 of the 2.5 mg Tylenol is safe for for blood pressure control bleeding may not work quite as well for pain he can use warm compresses also. Expect improvement within the next 3-5 days. Contact us in followup if not improving within the next week or so. Or as needed. If you increase amlodipine to 5 mg a day plan followup visit in about a month. You can sign up for my chart and send [place] readings are messages with questions or concerns.  Sinusitis Sinusitis is redness, soreness, and swelling (inflammation) of the paranasal sinuses. Paranasal sinuses are air pockets within the bones of your face (beneath the eyes, the middle of the forehead, or above the eyes). In healthy paranasal sinuses, mucus is able to drain out, and air is able to circulate through them by way of your nose. However, when your paranasal sinuses are inflamed, mucus and air can become trapped. This can allow bacteria and other germs to grow and cause infection. Sinusitis can develop quickly and last only a short time (acute) or continue over a long period (chronic). Sinusitis that lasts for more than 12 weeks is considered chronic.  CAUSES  Causes of sinusitis include:  Allergies.  Structural abnormalities, such as displacement of the cartilage that separates your nostrils (deviated septum), which can decrease the air flow through your nose and sinuses and affect sinus drainage.  Functional abnormalities, such as when the small hairs (cilia) that line your sinuses and help remove mucus do not work properly or are not present. SYMPTOMS  Symptoms of acute and chronic sinusitis are the same. The primary symptoms are pain and pressure around the affected  sinuses. Other symptoms include:  Upper toothache.  Earache.  Headache.  Bad breath.  Decreased sense of smell and taste.  A cough, which worsens when you are lying flat.  Fatigue.  Fever.  Thick drainage from your nose, which often is green and may contain pus (purulent).  Swelling and warmth over the affected sinuses. DIAGNOSIS  Your caregiver will perform a physical exam. During the exam, your caregiver may:  Look in your nose for signs of abnormal growths in your nostrils (nasal polyps).  Tap over the affected sinus to check for signs of infection.  View the inside of your sinuses (endoscopy) with a special imaging device with a light attached (endoscope), which is inserted into your sinuses. If your caregiver suspects that you have chronic sinusitis, one or more of the following tests may be recommended:  Allergy tests.  Nasal culture A sample of mucus is taken from your nose and sent to a lab and screened for bacteria.  Nasal cytology A sample of mucus is taken from your nose and examined by your caregiver to determine if your sinusitis is related to an allergy. TREATMENT  Most cases of acute sinusitis are related to a viral infection and will resolve on their own within 10 days. Sometimes medicines are prescribed to help relieve symptoms (pain medicine, decongestants, nasal steroid sprays, or saline sprays).  However, for sinusitis related to a bacterial infection, your caregiver will prescribe antibiotic medicines. These are medicines that will help kill the bacteria causing  the infection.  Rarely, sinusitis is caused by a fungal infection. In theses cases, your caregiver will prescribe antifungal medicine. For some cases of chronic sinusitis, surgery is needed. Generally, these are cases in which sinusitis recurs more than 3 times per year, despite other treatments. HOME CARE INSTRUCTIONS   Drink plenty of water. Water helps thin the mucus so your sinuses can drain  more easily.  Use a humidifier.  Inhale steam 3 to 4 times a day (for example, sit in the bathroom with the shower running).  Apply a warm, moist washcloth to your face 3 to 4 times a day, or as directed by your caregiver.  Use saline nasal sprays to help moisten and clean your sinuses.  Take over-the-counter or prescription medicines for pain, discomfort, or fever only as directed by your caregiver. SEEK IMMEDIATE MEDICAL CARE IF:  You have increasing pain or severe headaches.  You have nausea, vomiting, or drowsiness.  You have swelling around your face.  You have vision problems.  You have a stiff neck.  You have difficulty breathing. MAKE SURE YOU:   Understand these instructions.  Will watch your condition.  Will get help right away if you are not doing well or get worse. Document Released: 10/25/2005 Document Revised: 01/17/2012 Document Reviewed: 11/09/2011 [facility] Patient Information 2014 [place], [place].

## 2013-08-23 ENCOUNTER — Encounter (HOSPITAL_COMMUNITY): Payer: Self-pay | Admitting: Emergency Medicine

## 2013-08-23 ENCOUNTER — Other Ambulatory Visit: Payer: Self-pay | Admitting: Obstetrics and Gynecology

## 2013-08-23 ENCOUNTER — Telehealth: Payer: Self-pay | Admitting: Internal Medicine

## 2013-08-23 ENCOUNTER — Emergency Department (HOSPITAL_COMMUNITY)
Admission: EM | Admit: 2013-08-23 | Discharge: 2013-08-23 | Disposition: A | Discharge status: Home / Self Care | Payer: BC Managed Care – PPO | Attending: Emergency Medicine | Admitting: Emergency Medicine

## 2013-08-23 DIAGNOSIS — M542 Cervicalgia: Secondary | ICD-10-CM | POA: Insufficient documentation

## 2013-08-23 DIAGNOSIS — Z8619 Personal history of other infectious and parasitic diseases: Secondary | ICD-10-CM | POA: Insufficient documentation

## 2013-08-23 DIAGNOSIS — R928 Other abnormal and inconclusive findings on diagnostic imaging of breast: Secondary | ICD-10-CM

## 2013-08-23 DIAGNOSIS — Z862 Personal history of diseases of the blood and blood-forming organs and certain disorders involving the immune mechanism: Secondary | ICD-10-CM | POA: Insufficient documentation

## 2013-08-23 DIAGNOSIS — G43909 Migraine, unspecified, not intractable, without status migrainosus: Secondary | ICD-10-CM | POA: Insufficient documentation

## 2013-08-23 DIAGNOSIS — R5381 Other malaise: Secondary | ICD-10-CM | POA: Insufficient documentation

## 2013-08-23 DIAGNOSIS — Z79899 Other long term (current) drug therapy: Secondary | ICD-10-CM | POA: Insufficient documentation

## 2013-08-23 DIAGNOSIS — Z7982 Long term (current) use of aspirin: Secondary | ICD-10-CM | POA: Insufficient documentation

## 2013-08-23 DIAGNOSIS — I10 Essential (primary) hypertension: Secondary | ICD-10-CM | POA: Insufficient documentation

## 2013-08-23 DIAGNOSIS — R011 Cardiac murmur, unspecified: Secondary | ICD-10-CM | POA: Insufficient documentation

## 2013-08-23 DIAGNOSIS — Z8639 Personal history of other endocrine, nutritional and metabolic disease: Secondary | ICD-10-CM | POA: Insufficient documentation

## 2013-08-23 LAB — CBC WITH DIFFERENTIAL/PLATELET
Basophils Absolute: 0 10*3/uL (ref 0.0–0.1)
Eosinophils Absolute: 0.1 10*3/uL (ref 0.0–0.7)
Eosinophils Relative: 1 % (ref 0–5)
HCT: 41.9 % (ref 36.0–46.0)
Lymphocytes Relative: 26 % (ref 12–46)
MCH: 28.4 pg (ref 26.0–34.0)
MCV: 85 fL (ref 78.0–100.0)
Monocytes Absolute: 0.4 10*3/uL (ref 0.1–1.0)
Monocytes Relative: 6 % (ref 3–12)
Neutro Abs: 5.3 10*3/uL (ref 1.7–7.7)
RBC: 4.93 MIL/uL (ref 3.87–5.11)
RDW: 14 % (ref 11.5–15.5)
WBC: 7.8 10*3/uL (ref 4.0–10.5)

## 2013-08-23 LAB — BASIC METABOLIC PANEL
BUN: 9 mg/dL (ref 6–23)
CO2: 25 mEq/L (ref 19–32)
Calcium: 9.6 mg/dL (ref 8.4–10.5)
Creatinine, Ser: 0.8 mg/dL (ref 0.50–1.10)
Glucose, Bld: 121 mg/dL — ABNORMAL HIGH (ref 70–99)

## 2013-08-23 MED ORDER — DIPHENHYDRAMINE HCL 50 MG/ML IJ SOLN
25.0000 mg | Freq: Once | INTRAMUSCULAR | Status: AC
  Administered 2013-08-23: 25 mg via INTRAVENOUS
  Filled 2013-08-23: qty 1

## 2013-08-23 MED ORDER — METOCLOPRAMIDE HCL 5 MG/ML IJ SOLN
5.0000 mg | Freq: Once | INTRAMUSCULAR | Status: AC
  Administered 2013-08-23: 5 mg via INTRAVENOUS
  Filled 2013-08-23: qty 2

## 2013-08-23 NOTE — Telephone Encounter (Signed)
Patient Information:  Caller Name: Amanda Patterson  Phone: [phone]  Patient: Amanda Patterson  Gender: Female  DOB: May 13, 1956  Age: 57 Years  PCP: Berniece Andreas [facility])  Pregnant: No  Office Follow Up:  Does the office need to follow up with this patient?: No  Instructions For The Office: N/A  RN Note:  Neck pain rated 8/10. Can feel dime-sized, painful lymph node below right ear. Painful when moves head. Reports minor difficulty breathing and moderate weakness.  Denies sore throat.  Advised to go to [facility] ED now.  Symptoms  Reason For Call & Symptoms: Emergent call: Hypertension, weakness and neck pain, neck feels "thick."  Denies breathing problems.  BP 166/80.  Bilateral neck pain.  Ambulatory with assistance.  Reviewed Health History In EMR: Yes  Reviewed Medications In EMR: Yes  Reviewed Allergies In EMR: Yes  Reviewed Surgeries / Procedures: Yes  Date of Onset of Symptoms: 08/23/2013  Treatments Tried: apple juice  Treatments Tried Worked: No OB / GYN:  LMP: Unknown  Guideline(s) Used:  Neck Pain or Stiffness  Lymph Nodes - Swollen  Disposition Per Guideline:   Go to ED Now (or to Office with PCP Approval)  Reason For Disposition Reached:   Node is in the neck and causes difficulty breathing  Advice Given:  N/A  RN Overrode Recommendation:  Go To ED  Redge Gainer

## 2013-08-23 NOTE — ED Notes (Signed)
Pt with hx of occipital headaches with episodes of dizziness to ED c/o headache starting at noon and dizziness, described as loss of balance, starting at 1500.  AO x 4.  Dr Wilkie Aye assessed pt and does not feel this is a code stroke.

## 2013-08-23 NOTE — Telephone Encounter (Signed)
Noted  

## 2013-08-23 NOTE — ED Notes (Signed)
Dr Wilkie Aye at bedside again.

## 2013-08-23 NOTE — ED Notes (Signed)
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 Has a history of migraines. Headache/neck pain 8/10 at this time. Neuro intact.

## 2013-08-23 NOTE — ED Provider Notes (Signed)
CSN: [id number]     Arrival date & time 08/23/13  1621 History   First MD Initiated Contact with Patient 08/23/13 1631     Chief Complaint  Patient presents with  . Headache  . Dizziness   (Consider location/radiation/quality/duration/timing/severity/associated sxs/prior Treatment) HPI  This a 57 year old female with history of migraines who presents with headache and dizziness as well as hypertension. Patient states that she had onset of bilateral neck pain at approximately 2 PM this afternoon. It progressively got worse and then she had feelings of generalized weakness and feeling off balance. The patient denies any that the headache was maximal at onset.  She denies any room spinning dizziness. She denies any focal deficits or lateralizing weakness. Patient states that she frequently gets headaches and they are occipital and to start in her neck. She has had feelings of off balance in the past but this is worse. She rates her pain at 8/10. Patient was also noted to have high blood pressure at home at 166/88. She took her amlodipine prior to presenting. She denies any nausea or vomiting.  Past Medical History  Diagnosis Date  . Chicken pox   . Headache(784.0)   . Murmur, heart     MVP by hx   . BP (high blood pressure)   . High cholesterol   . Hx of varicella    Past Surgical History  Procedure Laterality Date  . Cesarean section  [id number]  . Biopsy breast     Family History  Problem Relation Age of Onset  . Diabetes Mother   . Hypertension Mother   . Stroke Father   . Heart disease Paternal Grandmother    History  Substance Use Topics  . Smoking status: Never Smoker   . Smokeless tobacco: Not on file  . Alcohol Use: No   OB History   Grav Para Term Preterm Abortions TAB SAB Ect Mult Living                 Review of Systems  Constitutional: Negative for fever.  Respiratory: Negative for cough, chest tightness and shortness of breath.   Cardiovascular: Negative  for chest pain.  Gastrointestinal: Negative for nausea, vomiting and abdominal pain.  Genitourinary: Negative for dysuria.  Musculoskeletal: Negative for back pain.  Skin: Negative for wound.  Neurological: Positive for dizziness, light-headedness and headaches. Negative for speech difficulty, weakness and numbness.  Psychiatric/Behavioral: Negative for confusion.  All other systems reviewed and are negative.    Allergies  Review of patient's allergies indicates no known allergies.  Home Medications   Current Outpatient Rx  Name  Route  Sig  Dispense  Refill  . amLODipine (NORVASC) 2.5 MG tablet   Oral   Take 2.5 mg by mouth daily.         Marland Kitchen aspirin EC 81 MG tablet   Oral   Take 81 mg by mouth daily.         . calcium carbonate (TUMS - DOSED IN MG ELEMENTAL CALCIUM) 500 MG chewable tablet   Oral   Chew 2 tablets by mouth daily as needed for heartburn.         . Cholecalciferol (VITAMIN D3) 2000 UNITS TABS   Oral   Take 1 tablet by mouth daily.         . Ginger, Zingiber officinalis, (GINGER PO)   Oral   Take 1 tablet by mouth daily.         . nadolol (CORGARD) 20 MG tablet  Oral   Take 10 mg by mouth daily.          BP 138/77  Pulse 74  Temp(Src) 97.9 F (36.6 C) (Oral)  Resp 18  Wt 135 lb 6.4 oz (61.417 kg)  BMI 24.35 kg/m2  SpO2 99% Physical Exam  Nursing note and vitals reviewed. Constitutional: She is oriented to person, place, and time. She appears well-developed and well-nourished. No distress.  HENT:  Head: Normocephalic and atraumatic.  Mouth/Throat: Oropharynx is clear and moist.  Eyes: EOM are normal. Pupils are equal, round, and reactive to light.  Neck: Neck supple.  Cardiovascular: Normal rate, regular rhythm and normal heart sounds.   No murmur heard. Pulmonary/Chest: Effort normal. No respiratory distress. She has no wheezes.  Abdominal: Soft. Bowel sounds are normal. There is no tenderness.  Musculoskeletal: She exhibits no  edema.  Neurological: She is alert and oriented to person, place, and time. She displays normal reflexes. No cranial nerve deficit. Coordination normal.  No dysmetria to finger-nose-finger, equal to 5 strength in all 4 extremities, no ataxia noted gait, no drift  Skin: Skin is warm and dry.  Psychiatric: She has a normal mood and affect.    ED Course  Procedures (including critical care time) Labs Review Labs Reviewed  BASIC METABOLIC PANEL - Abnormal; Notable for the following:    Glucose, Bld 121 (*)    GFR calc non Af Amer 81 (*)    All other components within normal limits  CBC WITH DIFFERENTIAL   Imaging Review No results found.  EKG Interpretation   None       MDM   1. Migraine   2. Hypertension     This is a 57 year old female who presents with progressive headache and dizziness. Patient denies room spinning dizziness. She states this is somewhat consistent with her prior migraines but the pain is worse. She denies worse headache of her life or maximal intensity at onset. At this time have low suspicion for subarachnoid hemorrhage. Patient's neurological exam is completely normal. She has no focal deficits and have low suspicion for cerebellar dysfunction causing her dizziness. Patient was given migraine cocktail. Patient had improvement of her symptoms. Patient was also noted to be hypertensive upon admission. She took amlodipine prior to being seen in her blood-pressure progressively got better during her stay. I discussed with the patient that I have low suspicion for acute stroke or subarachnoid hemorrhage. These cannot be fully ruled out imaging but given that the patient has gotten better and these are consistent with her prior migraines, I do not feel she needs further workup. Patient stated understanding. Part her headache may be secondary to her blood pressure as well.  Patient is to followup with her primary care physician regarding blood pressure medications and  control. She was given strict return precautions.  After history, exam, and medical workup I feel the patient has been appropriately medically screened and is safe for discharge home. Pertinent diagnoses were discussed with the patient. Patient was given return precautions.    Shon Baton, MD 08/24/13 [phone]

## 2013-08-23 NOTE — Telephone Encounter (Signed)
[place]/Patient Phone [phone] called regarding neck pain and BP 155/96.  No answer at time of call back.  Previously triaged and advised to go to ED.

## 2013-08-24 ENCOUNTER — Other Ambulatory Visit (INDEPENDENT_AMBULATORY_CARE_PROVIDER_SITE_OTHER): Payer: BC Managed Care – PPO

## 2013-08-24 DIAGNOSIS — R7309 Other abnormal glucose: Secondary | ICD-10-CM

## 2013-08-24 DIAGNOSIS — I10 Essential (primary) hypertension: Secondary | ICD-10-CM

## 2013-08-24 LAB — BASIC METABOLIC PANEL
BUN: 11 mg/dL (ref 6–23)
Calcium: 9.4 mg/dL (ref 8.4–10.5)
GFR: 87.86 mL/min (ref >60.00)
Glucose, Bld: 104 mg/dL — ABNORMAL HIGH (ref 70–99)
Sodium: 146 mEq/L — ABNORMAL HIGH (ref 135–145)

## 2013-08-24 NOTE — Telephone Encounter (Signed)
Pt came in this am for labs. Also, Pt would like you to know she went to  the hospital last night for the neck pain./headache.  Treated for migraine. Advised to fu w/ pcp in a couple of days to discuss  bp med.  Pt has fup on 10/27.  Would like to know if you would like her to come in prior to that appt? Pls advise

## 2013-08-24 NOTE — Telephone Encounter (Signed)
appt made/kh 

## 2013-08-24 NOTE — Telephone Encounter (Signed)
Pls advise.  

## 2013-08-24 NOTE — Telephone Encounter (Signed)
Can move  Up ov to next week.   Give 30 minutes

## 2013-08-24 NOTE — Telephone Encounter (Signed)
lmom for pt to make 30 min ov/kh

## 2013-08-28 ENCOUNTER — Ambulatory Visit (INDEPENDENT_AMBULATORY_CARE_PROVIDER_SITE_OTHER): Payer: BC Managed Care – PPO | Admitting: Internal Medicine

## 2013-08-28 ENCOUNTER — Encounter: Payer: Self-pay | Admitting: Internal Medicine

## 2013-08-28 VITALS — BP 140/94 | HR 65 | Temp 98.2°F | Wt 134.0 lb

## 2013-08-28 DIAGNOSIS — I10 Essential (primary) hypertension: Secondary | ICD-10-CM

## 2013-08-28 DIAGNOSIS — R51 Headache: Secondary | ICD-10-CM

## 2013-08-28 DIAGNOSIS — Z23 Encounter for immunization: Secondary | ICD-10-CM

## 2013-08-28 DIAGNOSIS — R739 Hyperglycemia, unspecified: Secondary | ICD-10-CM

## 2013-08-28 DIAGNOSIS — R7309 Other abnormal glucose: Secondary | ICD-10-CM

## 2013-08-28 DIAGNOSIS — M542 Cervicalgia: Secondary | ICD-10-CM

## 2013-08-28 NOTE — Progress Notes (Signed)
Chief Complaint  Patient presents with  . Follow-up    Hospital.  Headaches and neck pain.    HPI: Patient come in for follow up from ED visit 10 16 for ha and dizziness   No dx  Except migarine and  Ht    Here for fu.  was not taking med unless very high   Not every day .  Amlodipine   And may be an issue   Has log of readings  Now and most in range although some in 140 150 range .   Can get anxious with readings  Brings in machine .  Neck problematic for years  ? If chiro caused more headaches .  Needs help with evaluation and mangement.  ROS: See pertinent positives and negatives per HPI.  Past Medical History  Diagnosis Date  . Chicken pox   . Headache(784.0)   . Murmur, heart     MVP by hx   . BP (high blood pressure)   . High cholesterol   . Hx of varicella     Family History  Problem Relation Age of Onset  . Diabetes Mother   . Hypertension Mother   . Stroke Father   . Heart disease Paternal Grandmother     History   Social History  . Marital Status: Married    Spouse Name: N/A    Number of Children: N/A  . Years of Education: N/A   Social History Main Topics  . Smoking status: Never Smoker   . Smokeless tobacco: None  . Alcohol Use: No  . Drug Use: None  . Sexual Activity: None   Other Topics Concern  . 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              Outpatient Encounter Prescriptions as of 08/28/2013  Medication Sig Dispense Refill  . amLODipine (NORVASC) 2.5 MG tablet Take 2.5 mg by mouth daily.      . Cholecalciferol (VITAMIN D3) 2000 UNITS TABS Take 1 tablet by mouth daily.      . nadolol (CORGARD) 20 MG tablet Take 10 mg by mouth daily.      . [DISCONTINUED] aspirin EC 81 MG tablet Take 81 mg by mouth daily.      .  [DISCONTINUED] calcium carbonate (TUMS - DOSED IN MG ELEMENTAL CALCIUM) 500 MG chewable tablet Chew 2 tablets by mouth daily as needed for heartburn.      . [DISCONTINUED] fluticasone (FLONASE) 50 MCG/ACT nasal spray       . [DISCONTINUED] Ginger, Zingiber officinalis, (GINGER PO) Take 1 tablet by mouth daily.       No facility-administered encounter medications on file as of 08/28/2013.    EXAM:  BP 140/94  Pulse 65  Temp(Src) 98.2 F (36.8 C) (Oral)  Wt 134 lb (60.782 kg)  BMI 24.1 kg/m2  SpO2 97%  Body mass index is 24.1 kg/(m^2). BP repeats   160/100 and 90 and my cuff  158-160/86/90  GENERAL: vitals reviewed and listed above, alert, oriented, appears well hydrated and in no acute distress HEENT: atraumatic, conjunctiva  clear, no obvious abnormalities  on inspection of external nose and ears OP : no lesion edema or exudate  NECK: no obvious masses on inspection palpation  LUNGS: clear to auscultation bilaterally, no wheezes, rales or rhonchi,  CV: HRRR, no clubbing cyanosis or  peripheral edema nl cap refill  MS: moves all extremities without noticeable focal  abnormality PSYCH: pleasant and cooperative  Lab Results  Component Value Date   WBC 7.8 08/23/2013   HGB 14.0 08/23/2013   HCT 41.9 08/23/2013   PLT 310 08/23/2013   GLUCOSE 104* 08/24/2013   CHOL 206* 08/21/2012   TRIG 112.0 08/21/2012   HDL 53.80 08/21/2012   LDLDIRECT 145.4 08/21/2012   ALT 25 08/21/2012   AST 23 08/21/2012   NA 146* 08/24/2013   K 4.6 08/24/2013   CL 109 08/24/2013   CREATININE 0.9 08/24/2013   BUN 11 08/24/2013   CO2 30 08/24/2013   TSH 1.39 08/21/2012   HGBA1C 6.3 08/24/2013    ASSESSMENT AND PLAN:  Discussed the following assessment and plan:  Headache(784.0) - was seen in ed ? migraine ? cervicogenic non focal exam see neuro.  Hypertension  Other abnormal glucose - prediabetic range and stable  Neck pain - chornic  ? djd spoindylosis ? related to ha    Hyperglycemia  Need for prophylactic vaccination and inoculation against influenza - Plan: Flu Vaccine QUAD 36+ mos PF IM (Fluarix) Home Machine correlates   Some WC effect readings better at home. Take amlodipine qd low dose   Management of headaches and neck pain? If cervicogenic/ other . Labs reviewed lipids werent donw with labs   Prediabetes bg noted  PV in January and can do lipid check at that time .  lsi in the meantime  -Patient advised to return or notify health care team  if symptoms worsen or persist or new concerns arise.  Patient Instructions   Take the amlodipine 2.5 mg every day  For BP control. Will arrange neurology consult about the headaches and neck pains. ? If triggering your headaches . Continue to check Bp readings  About 3 days a week  Maximum or as needed.  If you are too anxious dont check your blood pressure.  dont check you blood pressure until  After your sons wedding.  Lab Results  Component Value Date   WBC 7.8 08/23/2013   HGB 14.0 08/23/2013   HCT 41.9 08/23/2013   PLT 310 08/23/2013   GLUCOSE 104* 08/24/2013   CHOL 206* 08/21/2012   TRIG 112.0 08/21/2012   HDL 53.80 08/21/2012   LDLDIRECT 145.4 08/21/2012   ALT 25 08/21/2012   AST 23 08/21/2012   NA 146* 08/24/2013   K 4.6 08/24/2013   CL 109 08/24/2013   CREATININE 0.9 08/24/2013   BUN 11 08/24/2013   CO2 30 08/24/2013   TSH 1.39 08/21/2012   HGBA1C 6.3 08/24/2013    Preventive  Visit  In January lipid panel pre visit      Burna Mortimer K. Orian Figueira M.D.

## 2013-08-28 NOTE — Patient Instructions (Addendum)
Take the amlodipine 2.5 mg every day  For BP control. Will arrange neurology consult about the headaches and neck pains. ? If triggering your headaches . Continue to check Bp readings  About 3 days a week  Maximum or as needed.  If you are too anxious dont check your blood pressure.  dont check you blood pressure until  After your sons wedding.  Lab Results  Component Value Date   WBC 7.8 08/23/2013   HGB 14.0 08/23/2013   HCT 41.9 08/23/2013   PLT 310 08/23/2013   GLUCOSE 104* 08/24/2013   CHOL 206* 08/21/2012   TRIG 112.0 08/21/2012   HDL 53.80 08/21/2012   LDLDIRECT 145.4 08/21/2012   ALT 25 08/21/2012   AST 23 08/21/2012   NA 146* 08/24/2013   K 4.6 08/24/2013   CL 109 08/24/2013   CREATININE 0.9 08/24/2013   BUN 11 08/24/2013   CO2 30 08/24/2013   TSH 1.39 08/21/2012   HGBA1C 6.3 08/24/2013    Preventive  Visit  In January lipid panel pre visit

## 2013-09-03 ENCOUNTER — Ambulatory Visit: Payer: BC Managed Care – PPO | Admitting: Internal Medicine

## 2013-10-30 ENCOUNTER — Ambulatory Visit
Admission: RE | Admit: 2013-10-30 | Discharge: 2013-10-30 | Disposition: A | Discharge status: Home / Self Care | Payer: BC Managed Care – PPO | Source: Ambulatory Visit | Attending: Obstetrics and Gynecology | Admitting: Obstetrics and Gynecology

## 2013-10-30 DIAGNOSIS — R928 Other abnormal and inconclusive findings on diagnostic imaging of breast: Secondary | ICD-10-CM

## 2013-11-22 ENCOUNTER — Other Ambulatory Visit: Payer: BC Managed Care – PPO

## 2013-11-28 ENCOUNTER — Encounter: Payer: BC Managed Care – PPO | Admitting: Internal Medicine

## 2013-11-28 ENCOUNTER — Encounter: Payer: Self-pay | Admitting: Internal Medicine

## 2013-11-28 NOTE — Progress Notes (Signed)
Document opened and reviewed for cpx  but appt  NS  same day .

## 2013-11-30 ENCOUNTER — Telehealth: Payer: Self-pay | Admitting: Family Medicine

## 2013-11-30 NOTE — Telephone Encounter (Signed)
Message copied by Hulda Humphrey on Fri Nov 30, 2013 10:25 AM ------      Message from: [facility]      Created: Thu Nov 29, 2013  1:25 PM      Regarding: ns       Contact patient about missed appt        Still needs some type of follow up or PV ------

## 2013-11-30 NOTE — Telephone Encounter (Signed)
Left message on home and cell for the pt to return my call. 

## 2013-11-30 NOTE — Telephone Encounter (Signed)
Pt returned my call and left a message on my machine.  Returned her call and left a message on her cell.

## 2013-12-03 NOTE — Telephone Encounter (Signed)
Left message on cell for the pt to return my call. 

## 2013-12-04 ENCOUNTER — Encounter: Payer: Self-pay | Admitting: Family Medicine

## 2013-12-04 NOTE — Telephone Encounter (Signed)
Left message on cell for the pt to return my call.  Will now send a contact letter.

## 2013-12-24 ENCOUNTER — Other Ambulatory Visit: Payer: Self-pay | Admitting: Internal Medicine

## 2013-12-29 ENCOUNTER — Other Ambulatory Visit: Payer: Self-pay | Admitting: Internal Medicine

## 2013-12-31 NOTE — Telephone Encounter (Signed)
I do not see where you have prescribed this before. Please advise.  Thanks!

## 2014-01-01 NOTE — Telephone Encounter (Signed)
Ok to refill x 6 months   (I dont believe i have don this rx before )

## 2014-02-18 ENCOUNTER — Other Ambulatory Visit (INDEPENDENT_AMBULATORY_CARE_PROVIDER_SITE_OTHER): Payer: BC Managed Care – PPO

## 2014-02-18 DIAGNOSIS — Z0000 Encounter for general adult medical examination without abnormal findings: Secondary | ICD-10-CM

## 2014-02-18 LAB — LIPID PANEL
CHOLESTEROL: 187 mg/dL (ref 0–200)
HDL: 45.5 mg/dL (ref >39.00)
LDL CALC: 123 mg/dL — AB (ref 0–99)
Total CHOL/HDL Ratio: 4
Triglycerides: 94 mg/dL (ref 0.0–149.0)
VLDL: 18.8 mg/dL (ref 0.0–40.0)

## 2014-02-18 LAB — BASIC METABOLIC PANEL
BUN: 12 mg/dL (ref 6–23)
CHLORIDE: 103 meq/L (ref 96–112)
CO2: 27 mEq/L (ref 19–32)
CREATININE: 0.8 mg/dL (ref 0.4–1.2)
Calcium: 9.1 mg/dL (ref 8.4–10.5)
GFR: 90.12 mL/min (ref >60.00)
Glucose, Bld: 100 mg/dL — ABNORMAL HIGH (ref 70–99)
POTASSIUM: 3.9 meq/L (ref 3.5–5.1)
SODIUM: 138 meq/L (ref 135–145)

## 2014-02-18 LAB — CBC WITH DIFFERENTIAL/PLATELET
Basophils Absolute: 0 10*3/uL (ref 0.0–0.1)
Basophils Relative: 0.4 % (ref 0.0–3.0)
EOS PCT: 1.7 % (ref 0.0–5.0)
Eosinophils Absolute: 0.1 10*3/uL (ref 0.0–0.7)
HCT: 41.2 % (ref 36.0–46.0)
Hemoglobin: 13.7 g/dL (ref 12.0–15.0)
Lymphocytes Relative: 19.5 % (ref 12.0–46.0)
Lymphs Abs: 1.6 10*3/uL (ref 0.7–4.0)
MCHC: 33.2 g/dL (ref 30.0–36.0)
MCV: 87.5 fl (ref 78.0–100.0)
MONO ABS: 0.8 10*3/uL (ref 0.1–1.0)
MONOS PCT: 9.4 % (ref 3.0–12.0)
NEUTROS PCT: 69 % (ref 43.0–77.0)
Neutro Abs: 5.6 10*3/uL (ref 1.4–7.7)
PLATELETS: 274 10*3/uL (ref 150.0–400.0)
RBC: 4.71 Mil/uL (ref 3.87–5.11)
RDW: 14.2 % (ref 11.5–14.6)
WBC: 8.1 10*3/uL (ref 4.5–10.5)

## 2014-02-18 LAB — HEPATIC FUNCTION PANEL
ALT: 26 U/L (ref 0–35)
AST: 23 U/L (ref 0–37)
Albumin: 3.9 g/dL (ref 3.5–5.2)
Alkaline Phosphatase: 53 U/L (ref 39–117)
BILIRUBIN DIRECT: 0 mg/dL (ref 0.0–0.3)
BILIRUBIN TOTAL: 0.4 mg/dL (ref 0.3–1.2)
Total Protein: 7.2 g/dL (ref 6.0–8.3)

## 2014-02-18 LAB — TSH: TSH: 1.92 u[IU]/mL (ref 0.35–5.50)

## 2014-02-25 ENCOUNTER — Encounter: Payer: BC Managed Care – PPO | Admitting: Internal Medicine

## 2014-02-27 ENCOUNTER — Encounter: Payer: Self-pay | Admitting: Internal Medicine

## 2014-02-27 ENCOUNTER — Ambulatory Visit (INDEPENDENT_AMBULATORY_CARE_PROVIDER_SITE_OTHER): Payer: BC Managed Care – PPO | Admitting: Internal Medicine

## 2014-02-27 ENCOUNTER — Telehealth: Payer: Self-pay | Admitting: Internal Medicine

## 2014-02-27 VITALS — BP 134/80 | HR 70 | Temp 98.1°F | Ht 62.25 in | Wt 137.0 lb

## 2014-02-27 DIAGNOSIS — I059 Rheumatic mitral valve disease, unspecified: Secondary | ICD-10-CM

## 2014-02-27 DIAGNOSIS — Z23 Encounter for immunization: Secondary | ICD-10-CM

## 2014-02-27 DIAGNOSIS — Z0000 Encounter for general adult medical examination without abnormal findings: Secondary | ICD-10-CM

## 2014-02-27 DIAGNOSIS — I10 Essential (primary) hypertension: Secondary | ICD-10-CM

## 2014-02-27 DIAGNOSIS — M542 Cervicalgia: Secondary | ICD-10-CM

## 2014-02-27 DIAGNOSIS — I341 Nonrheumatic mitral (valve) prolapse: Secondary | ICD-10-CM

## 2014-02-27 NOTE — Assessment & Plan Note (Signed)
no murmur heard no sx  follow neg fam hx sdc etc follow clinically

## 2014-02-27 NOTE — Telephone Encounter (Signed)
Relevant patient education mailed to patient.  

## 2014-02-27 NOTE — Patient Instructions (Addendum)
lifestyle intervention healthy eating and exercise . 150 minutes of exercise weeks  , weight at healthy levels ( Usually [id number]) Avoid trans fats and processed foods;  Increase fresh fruits and veges to 5 servings per day. And avoid sweet beverages  Including tea and juice.  considier neck exercises option . To see dr Milinda Cave in sports medicine  He is a DO. And sports medicine trained

## 2014-02-27 NOTE — Progress Notes (Signed)
Chief Complaint  Patient presents with  . Annual Exam  . Hypertension    HPI: Patient comes in today for Preventive Health Care visit  HT doing ok nose of meds Palpitations taking 1/2 20 mg corgard per day no problems exercise tolerance. ruight knee pain after stepping down stairs  Getting better  After 1-2 weeks had pop no falling.  Neck spasms off and on a problem   Did chiro in past and pt moist heat etc  Asks about that no neuro sx.  taking 2000 iu vit d per day  Health Maintenance  Topic Date Due  . Influenza Vaccine  06/08/2014  . Pap Smear  06/09/2015  . Mammogram  10/31/2015  . Colonoscopy  04/21/2020  . Tetanus/tdap  02/28/2024   Health Maintenance Review   ROS:  GEN/ HEENT: No fever, significant weight changes sweats headaches vision problems hearing changes, CV/ PULM; No chest pain shortness of breath cough, syncope,edema  change in exercise tolerance. GI /GU: No adominal pain, vomiting, change in bowel habits. No blood in the stool. No significant GU symptoms. SKIN/HEME: ,no acute skin rashes suspicious lesions or bleeding. No lymphadenopathy, nodules, masses.  NEURO/ PSYCH:  No neurologic signs such as weakness numbness. No depression anxiety. IMM/ Allergy: No unusual infections.  Allergy .   REST of 12 system review negative except as per HPI   Past Medical History  Diagnosis Date  . Chicken pox   . Headache(784.0)   . Murmur, heart     MVP by hx   . BP (high blood pressure)   . High cholesterol   . Hx of varicella     Family History  Problem Relation Age of Onset  . Diabetes Mother   . Hypertension Mother   . Stroke Father   . Heart disease Paternal Grandmother     History   Social History  . Marital Status: Married    Spouse Name: N/A    Number of Children: N/A  . Years of Education: N/A   Social History Main Topics  . Smoking status: Never Smoker   . Smokeless tobacco: None  . Alcohol Use: No  . Drug Use: None  . Sexual Activity:  None   Other Topics Concern  . 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    Walking 5 day per week.                 Outpatient Encounter Prescriptions as of 02/27/2014  Medication Sig  . amLODipine (NORVASC) 2.5 MG tablet TAKE 1 TABLET BY MOUTH EVERY DAY  . Cholecalciferol (VITAMIN D3) 2000 UNITS TABS Take 1 tablet by mouth daily.  . nadolol (CORGARD) 20 MG tablet TAKE 1/2 TABLET BY MOUTH EVERY DAY    EXAM:  BP 134/80  Pulse 70  Temp(Src) 98.1 F (36.7 C) (Oral)  Ht 5' 2.25" (1.581 m)  Wt 137 lb (62.143 kg)  BMI 24.86 kg/m2  SpO2 96%  Body mass index is 24.86 kg/(m^2).  Physical Exam: Vital signs reviewed QVZ:DGLO is a well-developed well-nourished alert cooperative    who appearsr stated age in no acute  distress.  HEENT: normocephalic atraumatic , Eyes: PERRL EOM's full, conjunctiva clear, Nares: paten,t no deformity discharge or tenderness., Ears: no deformity EAC's clear TMs with normal landmarks. Mouth: clear OP, no lesions, edema.  Moist mucous membranes. Dentition in adequate repair. NECK: supple without masses, thyromegaly or bruits. CHEST/PULM:  Clear to auscultation and percussion breath sounds equal no wheeze , rales or rhonchi. No chest wall deformities or tenderness. Breast: normal by inspection . No dimpling, discharge, masses, tenderness or discharge . Healed scar right breast  CV: PMI is nondisplaced, S1 S2 no gallops, murmurs, rubs. Mid sysptlic clicke easlity heard without m  Peripheral pulses are full without delay.No JVD .  ABDOMEN: Bowel sounds normal nontender  No guard or rebound, no hepato splenomegal no CVA tenderness.  No hernia. Extremtities:  No clubbing cyanosis or edema, no acute joint swelling or redness no focal atrophygoo drom  knee no effusion  Nl click  NEURO:  Oriented x3, cranial nerves 3-12 appear to be intact, no obvious focal weakness,gait within normal limits no abnormal reflexes or asymmetrical SKIN: No acute rashes normal turgor, color, no bruising or petechiae. PSYCH: Oriented, good eye contact, no obvious depression anxiety, cognition and judgment appear normal. LN: no cervical axillary inguinal adenopathy  Lab Results  Component Value Date   WBC 8.1 02/18/2014   HGB 13.7 02/18/2014   HCT 41.2 02/18/2014   PLT 274.0 02/18/2014   GLUCOSE 100* 02/18/2014   CHOL 187 02/18/2014   TRIG 94.0 02/18/2014   HDL 45.50 02/18/2014   LDLDIRECT 145.4 08/21/2012   LDLCALC 123* 02/18/2014   ALT 26 02/18/2014   AST 23 02/18/2014   NA 138 02/18/2014   K 3.9 02/18/2014   CL 103 02/18/2014   CREATININE 0.8 02/18/2014   BUN 12 02/18/2014   CO2 27 02/18/2014   TSH 1.92 02/18/2014   HGBA1C 6.3 08/24/2013    ASSESSMENT AND PLAN:  Discussed the following assessment and plan:  Visit for preventive health examination - tdap today   Need for Tdap vaccination - Plan: Tdap vaccine greater than or equal to 7yo IM  MVP (mitral valve prolapse) - no murmur heard no sx  follow neg fam hx sdc etc follow clinically   Hypertension - controlled  continue  Neck pain - intermittent hx of pt and chiro  exercises given to try,   Patient Care Team: Burnis Medin, MD as PCP - General (Internal Medicine) Darlyn Chamber, MD (Obstetrics and Gynecology) Lewie Loron as Physician Assistant (Chiropractic Medicine) Patient Instructions   lifestyle intervention healthy eating and exercise . 150 minutes of exercise weeks  , weight at healthy levels ( Usually [id number]) Avoid trans fats and processed foods;  Increase fresh fruits and veges to 5 servings per day. And avoid sweet beverages  Including tea and juice.  considier neck exercises option . To see dr Milinda Cave in sports medicine  He is a DO. And sports medicine trained      Standley Brooking.  Alix Lahmann M.D. Pre visit review using our clinic review tool, if applicable. No additional management support is needed unless otherwise documented below in the visit note.

## 2014-02-27 NOTE — Assessment & Plan Note (Signed)
Can seek chiro care on own  Otherwise  Exercises given   Poss see SM pt etc

## 2014-03-23 ENCOUNTER — Other Ambulatory Visit: Payer: Self-pay | Admitting: Internal Medicine

## 2014-03-25 NOTE — Telephone Encounter (Signed)
Sent to the pharmacy by e-scribe. 

## 2014-06-11 ENCOUNTER — Other Ambulatory Visit: Payer: Self-pay | Admitting: Obstetrics and Gynecology

## 2014-06-11 DIAGNOSIS — R921 Mammographic calcification found on diagnostic imaging of breast: Secondary | ICD-10-CM

## 2014-07-10 ENCOUNTER — Ambulatory Visit
Admission: RE | Admit: 2014-07-10 | Discharge: 2014-07-10 | Disposition: A | Discharge status: Home / Self Care | Payer: BC Managed Care – PPO | Source: Ambulatory Visit | Attending: Obstetrics and Gynecology | Admitting: Obstetrics and Gynecology

## 2014-07-10 ENCOUNTER — Encounter (INDEPENDENT_AMBULATORY_CARE_PROVIDER_SITE_OTHER): Payer: Self-pay

## 2014-07-10 DIAGNOSIS — R921 Mammographic calcification found on diagnostic imaging of breast: Secondary | ICD-10-CM

## 2014-08-28 ENCOUNTER — Ambulatory Visit (INDEPENDENT_AMBULATORY_CARE_PROVIDER_SITE_OTHER): Payer: BC Managed Care – PPO

## 2014-08-28 DIAGNOSIS — Z23 Encounter for immunization: Secondary | ICD-10-CM

## 2014-10-15 ENCOUNTER — Telehealth: Payer: Self-pay | Admitting: Internal Medicine

## 2014-10-15 NOTE — Telephone Encounter (Signed)
Patient called requesting a referral to a cardiologist.  She says she gets sharp chest pains from time to time and wants it checked out.  Please advise

## 2014-10-16 ENCOUNTER — Ambulatory Visit (INDEPENDENT_AMBULATORY_CARE_PROVIDER_SITE_OTHER): Payer: BC Managed Care – PPO | Admitting: Family Medicine

## 2014-10-16 ENCOUNTER — Telehealth: Payer: Self-pay | Admitting: Internal Medicine

## 2014-10-16 ENCOUNTER — Encounter: Payer: Self-pay | Admitting: Family Medicine

## 2014-10-16 VITALS — BP 140/90 | HR 74 | Temp 98.0°F | Wt 140.0 lb

## 2014-10-16 DIAGNOSIS — R0789 Other chest pain: Secondary | ICD-10-CM

## 2014-10-16 DIAGNOSIS — R079 Chest pain, unspecified: Secondary | ICD-10-CM

## 2014-10-16 MED ORDER — PANTOPRAZOLE SODIUM 40 MG PO TBEC: 40.0000 mg | DELAYED_RELEASE_TABLET | Freq: Every day | ORAL | Status: DC

## 2014-10-16 NOTE — Telephone Encounter (Signed)
Per misty pt needs to talk with CAN and I  Called pt back and transferred her to team health [phone]

## 2014-10-16 NOTE — Telephone Encounter (Signed)
Pt scheduled to see Dr. Yong Channel today at 2:30pm

## 2014-10-16 NOTE — Assessment & Plan Note (Signed)
Current symptoms relieved by Tums and previously relieved by PPI makes GERD a likely possibility (but pattern of 2-3 minutes relieved by heat at times makes this less likely). We will treat with PPI as this has helped previously. Strict precautions given for seeking care. Patient to follow up with Dr. Regis Bill nextMonday or Tuesday.   Patient does have some chest wall tenderness but I doubt costochondritis though this is possible.  Chest pain atypical for most patterns. Nonexertional and not relieved by rest points away from ACS-time pattern over 3 years and having normal stress test in this time also points away. Patient's EKG has slight diffuse ST elevations which would be concerning for pericarditis yet patient's pain is worsened by leaning forward which is abnormal. Additionally her EKG showed similar pattern in 2013 and doubt a recurrence of pericarditis intermittently for 2-3 days every month. Patient does travel a lot but I doubt PE with intermittent pain, no history DVT/PE and no signs PE on exam.

## 2014-10-16 NOTE — Telephone Encounter (Signed)
Pt is aware md out of office and she hopes she can wait that long. Please advise

## 2014-10-16 NOTE — Progress Notes (Signed)
Garret Reddish, MD Phone: [phone]  Subjective:   Amanda Patterson is a 58 y.o. year old very pleasant female patient who presents with the following:  Chest Pain- recurrent, not worsening Off and on has had intermittent chest pain for at least a year. Approximately once a month or every other month. Comes on for 2-3 days. Sharp pain off and on in left side of chest. Pain up to 7-8/10. Lasts 2-3 minutes. Usually 1-2x a day.  Goes away with placing heat on it. Tums also has helped when taking it. Seems to be worse with heavy meals or eating certain foods-like pastas or other heavy meals. Not worse when laying down at night. Not exertional and not relieved by rest. Does not occur in Am before eating. Worse with leaning forward  Yesterday, started with similar symptoms and had 1 episode. No shortness of breath, nausea, vomiting, diaphoresis, left arm or neck pain (other than some chronic neck pain)-and never has these symptoms.   Husband comes into room and gives further information. Has been going on at least 3 years and patient has had stress tests before. Noted stress test by echocardiogram in 2012 which was largely normal. Patient was having same symptoms at that time. Husband tells that she was treated for reflux and patient states her symptoms resolved when on a PPI.   ROS- no fever/chills. Mild cough.   Past Medical History- gestational diabetes, hypertension previously controlled on amlodipine and nadolol, mitral valve prolapse, hyperlipidemia with last LDL 123  Medications- reviewed and updated Current Outpatient Prescriptions  Medication Sig Dispense Refill  . amLODipine (NORVASC) 2.5 MG tablet TAKE 1 TABLET BY MOUTH EVERY DAY 90 tablet 3  . Cholecalciferol (VITAMIN D3) 2000 UNITS TABS Take 1 tablet by mouth daily.    . nadolol (CORGARD) 20 MG tablet TAKE 1/2 TABLET BY MOUTH EVERY DAY 90 tablet 2   No current facility-administered medications for this visit.    Objective: BP  140/90 mmHg  Pulse 74  Temp(Src) 98 F (36.7 C)  Wt 140 lb (63.504 kg)  SpO2 95% Gen: NAD, resting comfortably on table CV: RRR, 3/6 SEM heard best at LLSB with midsystolic click. no rubs or gallops Chest wall-mild tenderness at junction of ribs and sternum at rib #3.  Lungs: CTAB no crackles, wheeze, rhonchi Abdomen: soft/nontender/nondistended/normal bowel sounds. No rebound or guarding.  Ext: no edema, no calf or leg swelling Skin: warm, dry, no rash over chest   EKG- 1st degree AV block, rate 75, normal axis, normal intervals other than PR, no hypertrophy, flattened t wave in III, <1 box elevation diffusely. EKG largely unchanged from 01/07/2014.   Assessment/Plan:  Chest pain  Current symptoms relieved by Tums and previously relieved by PPI makes GERD a likely possibility (but pattern of 2-3 minutes relieved by heat at times makes this less likely). We will treat with PPI as this has helped previously. Strict precautions given for seeking care. Patient to follow up with Dr. Regis Bill nextMonday or Tuesday.   Patient does have some chest wall tenderness but I doubt costochondritis though this is possible.  Chest pain atypical for most patterns. Nonexertional and not relieved by rest points away from ACS-time pattern over 3 years and having normal stress test in this time also points away. Patient's EKG has slight diffuse ST elevations which would be concerning for pericarditis yet patient's pain is worsened by leaning forward which is abnormal. Additionally her EKG showed similar pattern in 2013 and doubt a recurrence of  pericarditis intermittently for 2-3 days every month. Patient does travel a lot but I doubt PE with intermittent pain, no history DVT/PE and no signs PE on exam.    Meds ordered this encounter  Medications  . pantoprazole (PROTONIX) 40 MG tablet    Sig: Take 1 tablet (40 mg total) by mouth daily.    Dispense:  30 tablet    Refill:  3

## 2014-10-16 NOTE — Patient Instructions (Signed)
The most relieving portion of your history is that your symptoms were present when you had a normal stress test before and that they resolved with a reflux medicine.   Let's restart nexium and see if that helps you. See Dr. Regis Bill back next week on Monday or Tuesday or obviously seek care if symptoms were to worsen or the other symptoms we discussed were to become present.

## 2014-10-16 NOTE — Telephone Encounter (Signed)
PLEASE NOTE: All timestamps contained within this report are represented as [place] Standard Time. CONFIDENTIALTY NOTICE: This fax transmission is intended only for the addressee. It contains information that is legally privileged, confidential or otherwise protected from use or disclosure. If you are not the intended recipient, you are strictly prohibited from reviewing, disclosing, copying using or disseminating any of this information or taking any action in reliance on or regarding this information. If you have received this fax in error, please notify us immediately by telephone so that we can arrange for its return to [place]. Phone: [phone], Toll-Free: [phone], Fax: [phone] Page: 1 of 2 Call Id: [id number] [facility] [place] Patient Name: Amanda Patterson Gender: Female DOB: Feb 11, 1956 Age: 58 Y 10 M 25 D Return Phone Number: [phone] (Primary), [phone] (Secondary) Address: [address] City/State/Zip: [place] [address] Client [facility] - Client [facility] Day Physician Shanon Ace Contact Type Call Call Type Triage / Clinical Relationship To Patient Self Return Phone Number [phone] (Primary) Chief Complaint CHEST PAIN (>=21 years) - pain, pressure, heaviness or tightness Initial Comment caller states she has sharp pain in her chest - on and off - not sure what it is PreDisposition Did not know what to do Nurse Assessment Nurse: Mechele Dawley, RN, Amy Date/Time Eilene Ghazi Time): 10/16/2014 11:04:31 AM Confirm and document reason for call. If symptomatic, describe symptoms. ---CALLER STATES THAT IT IS HAPPENING TOO OFTEN FOR HER COMFORT. SHARP PAIN IN THE CHEST INTERMITTENT. CHEST PAIN STARTED YESTERDAY. SHE STATES ITS ON THE LEFT SIDE OF THE CHEST RIGHT AT THE BREAST AREA. SHE STATES THAT WHEN SHE BENDS DOWN SHE  CAN FEEL IT. SHE STATES THAT SHE HAS HAD THIS BEFORE. SHE STATES THAT SHE WILL TAKE AN ASA IF IT GETS BAD. SOMETIMES SHE WILL APPLY HEAT TO HELP WITH THE MUSCLES. SHE STATES SHE HAS TRIED THOSE THINGS AND IT RELIEVED IT SOME. PAIN 7-8/10. WHEN THE PAIN COMES IT IS VERY SHORT - 2-3 SECONDS. SHE STATES ITS A SHARP PAIN. NO N/V, NO SOB. - HTN, BS - 87 Has the patient traveled out of the country within the last 30 days? ---Not Applicable Does the patient require triage? ---Yes Related visit to physician within the last 2 weeks? ---No Does the PT have any chronic conditions? (i.e. diabetes, asthma, etc.) ---Yes List chronic conditions. ---HTN Guidelines Guideline Title Affirmed Question Affirmed Notes Nurse Date/Time (Eastern Time) Chest Pain Patient sounds very sick or weak to the triager CHEST PAIN - INFLAMMATION - AT PRESENT - [address] [place], [place] 10/16/2014 11:06:03 AM PLEASE NOTE: All timestamps contained within this report are represented as [place] Standard Time. CONFIDENTIALTY NOTICE: This fax transmission is intended only for the addressee. It contains information that is legally privileged, confidential or otherwise protected from use or disclosure. If you are not the intended recipient, you are strictly prohibited from reviewing, disclosing, copying using or disseminating any of this information or taking any action in reliance on or regarding this information. If you have received this fax in error, please notify us immediately by telephone so that we can arrange for its return to [place]. Phone: [phone], Toll-Free: [phone], Fax: [phone] Page: 2 of 2 Call Id: [id number] [place]. Time Eilene Ghazi Time) Disposition Final User 10/16/2014 10:58:32 AM Send to Urgent Queue Byrd Hesselbach 10/16/2014 11:09:45 AM Go to ED Now (or PCP triage) Yes Mechele Dawley, RN, Amy Caller Understands: Yes  Disagree/Comply: Comply Care Advice Given Per Guideline GO TO ED NOW (OR PCP TRIAGE):  DRIVING: Another adult should drive. BRING MEDICINES: * Please bring a list of your current medicines when you go to see the doctor. CALL EMS IF: * Severe difficulty breathing occurs * Passes out or becomes too weak to stand * You become worse. CARE ADVICE given per Chest Pain (Adult) guideline. After Care Instructions Given Call Event Type User Date / Time Description Referrals [facility] - ED

## 2014-10-22 ENCOUNTER — Ambulatory Visit (INDEPENDENT_AMBULATORY_CARE_PROVIDER_SITE_OTHER): Payer: BC Managed Care – PPO | Admitting: Internal Medicine

## 2014-10-22 ENCOUNTER — Encounter: Payer: Self-pay | Admitting: Internal Medicine

## 2014-10-22 VITALS — BP 142/72 | Temp 98.4°F | Ht 62.25 in | Wt 138.0 lb

## 2014-10-22 DIAGNOSIS — R079 Chest pain, unspecified: Secondary | ICD-10-CM

## 2014-10-22 DIAGNOSIS — I341 Nonrheumatic mitral (valve) prolapse: Secondary | ICD-10-CM

## 2014-10-22 DIAGNOSIS — I10 Essential (primary) hypertension: Secondary | ICD-10-CM | POA: Insufficient documentation

## 2014-10-22 HISTORY — DX: Chest pain, unspecified: R07.9

## 2014-10-22 NOTE — Patient Instructions (Signed)
Stay   On the protonix   At this time Exam is good  But have the click for MVP.  Take blood pressure readings twice a day for 7- 10 days and then periodically .To ensure below 140/90   .Send in readings     If   Not at goal below 140/90 we may increase the amlodipine to 5 mg per day ( or 2.5 .Marland KitchenMarland Kitchen2 per day )   Can try  Probiotic   for bloating or such.   Will arrange  Referral to cardiology  To see if they advise repeat stress test or echo etc.   ROV in  1-2 months ( after cards see you)

## 2014-10-22 NOTE — Progress Notes (Signed)
Pre visit review using our clinic review tool, if applicable. No additional management support is needed unless otherwise documented below in the visit note.  Chief Complaint  Patient presents with  . Follow-up    chest pain     HPI: Amanda Patterson 58 y.o. comesin for fu of atypical cp evaluted by dr hunter acutely  And given trial of ppi   Has hx of neg eval with cp in the remote past  Neg stress echo 2012  In [place] About  60 % better.   Coming and going   Sharp pain.  Mid left No limitations.  Not nocturnal.  Trying to eat well. Some bloating and   Inc defecation no diarrhea  Ok sometimes feels swollen luq and goes away . No cough sob syncope Back  Off on caffiene.  Green tea.  bp:not checking but has monitor at home. Has been o Recurrent bp regimen  For a while ROS: See pertinent positives and negatives per HPI.  Past Medical History  Diagnosis Date  . Chicken pox   . Headache(784.0)   . Murmur, heart     MVP by hx  stress echo 2012 midl post prolapse valve  . BP (high blood pressure)   . High cholesterol   . Hx of varicella     Family History  Problem Relation Age of Onset  . Diabetes Mother   . Hypertension Mother   . Stroke Father   . Heart disease Paternal Grandmother     History   Social History  . Marital Status: Married    Spouse Name: N/A    Number of Children: N/A  . Years of Education: N/A   Social History Main Topics  . Smoking status: Never Smoker   . Smokeless tobacco: None  . Alcohol Use: No  . Drug Use: None  . Sexual Activity: None   Other Topics Concern  . 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    Walking 5 day per week.                 Outpatient Encounter Prescriptions as of 10/22/2014  Medication Sig  .  amLODipine (NORVASC) 2.5 MG tablet TAKE 1 TABLET BY MOUTH EVERY DAY  . Cholecalciferol (VITAMIN D3) 2000 UNITS TABS Take 1 tablet by mouth daily.  . nadolol (CORGARD) 20 MG tablet TAKE 1/2 TABLET BY MOUTH EVERY DAY  . pantoprazole (PROTONIX) 40 MG tablet Take 1 tablet (40 mg total) by mouth daily.    EXAM:  BP 142/72 mmHg  Temp(Src) 98.4 F (36.9 C) (Oral)  Ht 5' 2.25" (1.581 m)  Wt 138 lb (62.596 kg)  BMI 25.04 kg/m2  Body mass index is 25.04 kg/(m^2).  GENERAL: vitals reviewed and listed above, alert, oriented, appears well hydrated and in no acute distress HEENT: atraumatic, conjunctiva  clear, no obvious abnormalities on inspection of external nose and ears NECK: no obvious masses on inspection palpation  LUNGS: clear to auscultation bilaterally, no wheezes, rales or rhonchi, good air movement CV: HRRR, no clubbing cyanosis or  peripheral edema nl cap refill   Mid systolic click dont  hear murmur  MS: moves all extremities without noticeable focal  Abnormality Abdomen:  Sof,t normal bowel sounds without hepatosplenomegaly, no guarding rebound or masses no CVA tenderness PSYCH: pleasant and cooperative, no obvious depression or anxiety Lab Results  Component Value Date   WBC 8.1 02/18/2014   HGB 13.7 02/18/2014   HCT 41.2 02/18/2014   PLT 274.0 02/18/2014   GLUCOSE 100* 02/18/2014   CHOL 187 02/18/2014   TRIG 94.0 02/18/2014   HDL 45.50 02/18/2014   LDLDIRECT 145.4 08/21/2012   LDLCALC 123* 02/18/2014   ALT 26 02/18/2014   AST 23 02/18/2014   NA 138 02/18/2014   K 3.9 02/18/2014   CL 103 02/18/2014   CREATININE 0.8 02/18/2014   BUN 12 02/18/2014   CO2 27 02/18/2014   TSH 1.92 02/18/2014   HGBA1C 6.3 08/24/2013    ASSESSMENT AND PLAN:  Discussed the following assessment and plan:  Chest pain, unspecified chest pain type - poss gi related  uncertain has risk factors  cont protonix cards check rov in 1-2 months - Plan: Ambulatory referral to Cardiology  MVP  (mitral valve prolapse) - has click no m heard on exam  - Plan: Ambulatory referral to Cardiology  Essential hypertension - if not at goal consdier intensification of  rx  as discussed  - Plan: Ambulatory referral to Cardiology Disc Cards  Opinion cause of risk factors   ht etc  ? Need for repeat echo but dont hear m today. -Patient advised to return or notify health care team  if symptoms worsen ,persist or new concerns arise.  Patient Instructions  Stay   On the protonix   At this time Exam is good  But have the click for MVP.  Take blood pressure readings twice a day for 7- 10 days and then periodically .To ensure below 140/90   .Send in readings     If   Not at goal below 140/90 we may increase the amlodipine to 5 mg per day ( or 2.5 .Marland KitchenMarland Kitchen2 per day )   Can try  Probiotic   for bloating or such.   Will arrange  Referral to cardiology  To see if they advise repeat stress test or echo etc.   ROV in  1-2 months ( after cards see you)  Standley Brooking. Panosh M.D.

## 2014-11-22 ENCOUNTER — Encounter: Payer: Self-pay | Admitting: Cardiology

## 2014-11-22 ENCOUNTER — Telehealth: Payer: Self-pay | Admitting: Cardiology

## 2014-11-22 ENCOUNTER — Ambulatory Visit (INDEPENDENT_AMBULATORY_CARE_PROVIDER_SITE_OTHER): Payer: BLUE CROSS/BLUE SHIELD | Admitting: Cardiology

## 2014-11-22 VITALS — BP 150/92 | HR 73 | Ht 62.25 in | Wt 139.0 lb

## 2014-11-22 DIAGNOSIS — I10 Essential (primary) hypertension: Secondary | ICD-10-CM

## 2014-11-22 DIAGNOSIS — I340 Nonrheumatic mitral (valve) insufficiency: Secondary | ICD-10-CM

## 2014-11-22 DIAGNOSIS — E785 Hyperlipidemia, unspecified: Secondary | ICD-10-CM

## 2014-11-22 DIAGNOSIS — I341 Nonrheumatic mitral (valve) prolapse: Secondary | ICD-10-CM

## 2014-11-22 DIAGNOSIS — R0789 Other chest pain: Secondary | ICD-10-CM

## 2014-11-22 MED ORDER — AMLODIPINE BESYLATE 5 MG PO TABS: 5.0000 mg | ORAL_TABLET | Freq: Every day | ORAL | Status: DC

## 2014-11-22 NOTE — Patient Instructions (Signed)
Your physician has recommended you make the following change in your medication:   INCREASE YOUR AMLODIPINE TO 5 MG ONCE DAILY   Your physician has requested that you have an echocardiogram. Echocardiography is a painless test that uses sound waves to create images of your heart. It provides your doctor with information about the size and shape of your heart and how well your heart's chambers and valves are working. This procedure takes approximately one hour. There are no restrictions for this procedure.    Your physician wants you to follow-up in: [place] will receive a reminder letter in the mail two months in advance. If you don't receive a letter, please call our office to schedule the follow-up appointment.

## 2014-11-22 NOTE — Telephone Encounter (Signed)
INFORMED THE PT OF RED YEAST RICE OTC FOR CHOLESTEROL.  PT VERBALIZED UNDERSTANDING AND GRACIOUS FOR THE CALL BACK.

## 2014-11-22 NOTE — Telephone Encounter (Signed)
New message      Pt was seen this am.  Dr Meda Coffee told her to get an over the counter cholesterol medication. She did not write it down.  What was the name of the medication?

## 2014-11-22 NOTE — Progress Notes (Signed)
Patient ID: Amanda Patterson, female   DOB: February 15, 1956, 59 y.o.   MRN: [id number]     Patient Name: Amanda Patterson Date of Encounter: 11/22/2014  Primary Care Provider:  Lottie Dawson, MD Primary Cardiologist:  [name]   Problem List   Past Medical History  Diagnosis Date  . Chicken pox   . Headache(784.0)   . Murmur, heart     MVP by hx  stress echo 2012 midl post prolapse valve  . BP (high blood pressure)   . High cholesterol   . Hx of varicella    Past Surgical History  Procedure Laterality Date  . Cesarean section  [id number]  . Biopsy breast      Allergies  No Known Allergies  HPI  A very pleasant 59 year old female who has prior medical history of hypertension and mitral valve prolapse and is coming with concern of chest pain. Her pain started about a month ago acute at rest was left-sided and sharp. Pain was not associated with any other symptoms such as shortness of breath or dizziness. Pain had no radiation. She saw her primary care physician that started her on proton next that completely resolved her chest pain. The patient had a stress echocardiogram performed in 2012 that showed excellent exercise capacity, no ischemia, mild prolapse of the posterior mitral valve leaflet and only trace mitral and tricuspid regurgitation. Her left atrial size was normal. She walks daily about 2 miles and feels no shortness of breath or chest pain. There is no family history of premature coronary artery disease.  Home Medications  Prior to Admission medications   Medication Sig Start Date End Date Taking? Authorizing Provider  amLODipine (NORVASC) 2.5 MG tablet TAKE 1 TABLET BY MOUTH EVERY DAY   Yes Burnis Medin, MD  Cholecalciferol (VITAMIN D3) 2000 UNITS TABS Take 1 tablet by mouth daily.   Yes Historical Provider, MD  nadolol (CORGARD) 20 MG tablet TAKE 1/2 TABLET BY MOUTH EVERY DAY   Yes Burnis Medin, MD  pantoprazole (PROTONIX) 40 MG tablet Take 1 tablet (40  mg total) by mouth daily. 10/16/14  Yes Marin Olp, MD    Family History  Family History  Problem Relation Age of Onset  . Diabetes Mother   . Hypertension Mother   . Stroke Father   . Heart disease Paternal Grandmother   . Stroke Brother   . Heart attack Neg Hx   . Hypertension Father     Social History  History   Social History  . Marital Status: Married    Spouse Name: N/A    Number of Children: N/A  . Years of Education: N/A   Occupational History  . Not on file.   Social History Main Topics  . Smoking status: Never Smoker   . Smokeless tobacco: Not on file  . Alcohol Use: No  . Drug Use: Not on file  . Sexual Activity: Not on file   Other Topics Concern  . 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    Walking 5 day per week.  Review of Systems, as per HPI, otherwise negative General:  No chills, fever, night sweats or weight changes.  Cardiovascular:  No chest pain, dyspnea on exertion, edema, orthopnea, palpitations, paroxysmal nocturnal dyspnea. Dermatological: No rash, lesions/masses Respiratory: No cough, dyspnea Urologic: No hematuria, dysuria Abdominal:   No nausea, vomiting, diarrhea, bright red blood per rectum, melena, or hematemesis Neurologic:  No visual changes, wkns, changes in mental status. All other systems reviewed and are otherwise negative except as noted above.  Physical Exam  Blood pressure 150/92, pulse 73, height 5' 2.25" (1.581 m), weight 139 lb (63.05 kg).  General: Pleasant, NAD Psych: Normal affect. Neuro: Alert and oriented X 3. Moves all extremities spontaneously. HEENT: Normal  Neck: Supple without bruits or JVD. Lungs:  Resp regular and unlabored, CTA. Heart: RRR no s3,  s4, midsystolic click followed by 3/6 systolic murmur.. Abdomen: Soft, non-tender, non-distended, BS + x 4.  Extremities: No clubbing, cyanosis or edema. DP/PT/Radials 2+ and equal bilaterally.  Labs:  No results for input(s): CKTOTAL, CKMB, TROPONINI in the last 72 hours. Lab Results  Component Value Date   WBC 8.1 02/18/2014   HGB 13.7 02/18/2014   HCT 41.2 02/18/2014   MCV 87.5 02/18/2014   PLT 274.0 02/18/2014    No results found for: DDIMER Invalid input(s): POCBNP    Component Value Date/Time   NA 138 02/18/2014 0857   K 3.9 02/18/2014 0857   CL 103 02/18/2014 0857   CO2 27 02/18/2014 0857   GLUCOSE 100* 02/18/2014 0857   BUN 12 02/18/2014 0857   CREATININE 0.8 02/18/2014 0857   CALCIUM 9.1 02/18/2014 0857   PROT 7.2 02/18/2014 0857   ALBUMIN 3.9 02/18/2014 0857   AST 23 02/18/2014 0857   ALT 26 02/18/2014 0857   ALKPHOS 53 02/18/2014 0857   BILITOT 0.4 02/18/2014 0857   GFRNONAA 81* 08/23/2013 1643   GFRAA >90 08/23/2013 1643   Lab Results  Component Value Date   CHOL 187 02/18/2014   HDL 45.50 02/18/2014   LDLCALC 123* 02/18/2014   TRIG 94.0 02/18/2014    Accessory Clinical Findings  Echocardiogram - stress echo in 2012 see description in history of present illness.  ECG - normal sinus rhythm, 73 bpm, normal EKG.   Assessment & Plan  59 year old female  1. Atypical chest pain - resolved after starting Protonix. She is completely normal resting EKG and normal stress test in 2012, she is very active and asymptomatic during exercise. There is no reason to perform stress test at this point.  2. Mitral valve prolapse - mild prolapse of the posterior leaflet of the mitral valve based on the report from 2012 associated with only trivial mitral regurgitation. However it appears on physical exam that her murmur has worsened. We will order an echocardiogram to evaluate.  3. Hypertension - its critical daily control blood pressure well especially since she has  mitral valve prolapse associated with mitral regurgitation, we will therefore increase amlodipine to 5 mg daily.  4. Hyperlipidemia with LDL of 123: Is less than 100. She is advised to start taking red yeast rice.   Follow up in 1 year  [name], MD, [facility] 11/22/2014, 10:41 AM

## 2014-12-13 ENCOUNTER — Ambulatory Visit (HOSPITAL_COMMUNITY): Payer: BLUE CROSS/BLUE SHIELD | Attending: Cardiology | Admitting: Radiology

## 2014-12-13 DIAGNOSIS — R079 Chest pain, unspecified: Secondary | ICD-10-CM | POA: Diagnosis present

## 2014-12-13 DIAGNOSIS — I341 Nonrheumatic mitral (valve) prolapse: Secondary | ICD-10-CM

## 2014-12-13 DIAGNOSIS — I340 Nonrheumatic mitral (valve) insufficiency: Secondary | ICD-10-CM

## 2014-12-13 DIAGNOSIS — R0789 Other chest pain: Secondary | ICD-10-CM

## 2014-12-13 NOTE — Progress Notes (Signed)
Echocardiogram performed.  

## 2014-12-16 ENCOUNTER — Telehealth: Payer: Self-pay | Admitting: Cardiology

## 2014-12-16 NOTE — Telephone Encounter (Signed)
New Msg         Pt returning call about Echo results.   Please call back.

## 2014-12-16 NOTE — Telephone Encounter (Signed)
Pt notified of normal echo per Dr Meda Coffee.  Pt verbalized understanding.

## 2015-01-09 ENCOUNTER — Other Ambulatory Visit: Payer: Self-pay | Admitting: Internal Medicine

## 2015-01-09 NOTE — Telephone Encounter (Signed)
Sent to the pharmacy for 90 days. Pt has an upcoming CPX on 03/03/15

## 2015-02-24 ENCOUNTER — Other Ambulatory Visit (INDEPENDENT_AMBULATORY_CARE_PROVIDER_SITE_OTHER): Payer: BLUE CROSS/BLUE SHIELD

## 2015-02-24 DIAGNOSIS — Z0000 Encounter for general adult medical examination without abnormal findings: Secondary | ICD-10-CM

## 2015-02-24 LAB — HEPATIC FUNCTION PANEL
ALT: 22 U/L (ref 0–35)
AST: 18 U/L (ref 0–37)
Albumin: 4.1 g/dL (ref 3.5–5.2)
Alkaline Phosphatase: 57 U/L (ref 39–117)
Bilirubin, Direct: 0 mg/dL (ref 0.0–0.3)
Total Bilirubin: 0.3 mg/dL (ref 0.2–1.2)
Total Protein: 7 g/dL (ref 6.0–8.3)

## 2015-02-24 LAB — LIPID PANEL
Cholesterol: 175 mg/dL (ref 0–200)
HDL: 45.7 mg/dL (ref >39.00)
LDL Cholesterol: 114 mg/dL — ABNORMAL HIGH (ref 0–99)
NONHDL: 129.3
Total CHOL/HDL Ratio: 4
Triglycerides: 76 mg/dL (ref 0.0–149.0)
VLDL: 15.2 mg/dL (ref 0.0–40.0)

## 2015-02-24 LAB — CBC WITH DIFFERENTIAL/PLATELET
BASOS ABS: 0 10*3/uL (ref 0.0–0.1)
Basophils Relative: 0.6 % (ref 0.0–3.0)
EOS PCT: 2.1 % (ref 0.0–5.0)
Eosinophils Absolute: 0.1 10*3/uL (ref 0.0–0.7)
HCT: 39.5 % (ref 36.0–46.0)
HEMOGLOBIN: 13.2 g/dL (ref 12.0–15.0)
Lymphocytes Relative: 34.5 % (ref 12.0–46.0)
Lymphs Abs: 2.2 10*3/uL (ref 0.7–4.0)
MCHC: 33.4 g/dL (ref 30.0–36.0)
MCV: 85.2 fl (ref 78.0–100.0)
Monocytes Absolute: 0.5 10*3/uL (ref 0.1–1.0)
Monocytes Relative: 7.2 % (ref 3.0–12.0)
Neutro Abs: 3.6 10*3/uL (ref 1.4–7.7)
Neutrophils Relative %: 55.6 % (ref 43.0–77.0)
Platelets: 284 10*3/uL (ref 150.0–400.0)
RBC: 4.63 Mil/uL (ref 3.87–5.11)
RDW: 14.8 % (ref 11.5–15.5)
WBC: 6.5 10*3/uL (ref 4.0–10.5)

## 2015-02-24 LAB — BASIC METABOLIC PANEL
BUN: 13 mg/dL (ref 6–23)
CO2: 27 mEq/L (ref 19–32)
CREATININE: 0.82 mg/dL (ref 0.40–1.20)
Calcium: 9.4 mg/dL (ref 8.4–10.5)
Chloride: 108 mEq/L (ref 96–112)
GFR: 92.32 mL/min (ref >60.00)
Glucose, Bld: 103 mg/dL — ABNORMAL HIGH (ref 70–99)
POTASSIUM: 4.2 meq/L (ref 3.5–5.1)
Sodium: 141 mEq/L (ref 135–145)

## 2015-02-24 LAB — TSH: TSH: 2.82 u[IU]/mL (ref 0.35–4.50)

## 2015-03-03 ENCOUNTER — Encounter: Payer: BC Managed Care – PPO | Admitting: Internal Medicine

## 2015-03-11 ENCOUNTER — Encounter: Payer: Self-pay | Admitting: Internal Medicine

## 2015-03-11 ENCOUNTER — Ambulatory Visit (INDEPENDENT_AMBULATORY_CARE_PROVIDER_SITE_OTHER): Payer: BLUE CROSS/BLUE SHIELD | Admitting: Internal Medicine

## 2015-03-11 VITALS — BP 124/80 | Temp 98.1°F | Ht 62.0 in | Wt 139.7 lb

## 2015-03-11 DIAGNOSIS — Z0000 Encounter for general adult medical examination without abnormal findings: Secondary | ICD-10-CM | POA: Diagnosis not present

## 2015-03-11 DIAGNOSIS — I10 Essential (primary) hypertension: Secondary | ICD-10-CM

## 2015-03-11 DIAGNOSIS — R739 Hyperglycemia, unspecified: Secondary | ICD-10-CM

## 2015-03-11 NOTE — Patient Instructions (Signed)
Continue good blood pressure control. Calf pains prob from standing in heels walk flat shoes  Bit can try advil short term  but if  persistent or progressive progressive  Or swelling get back with [place] to reevaluate.     Healthy lifestyle includes : At least 150 minutes of exercise weeks  , weight at healthy levels, which is usually   BMI 19-25. Avoid trans fats and processed foods;  Increase fresh fruits and veges to 5 servings per day. And avoid sweet beverages including tea and juice. Mediterranean diet with olive oil and nuts have been noted to be heart and brain healthy . Avoid tobacco products . Limit  alcohol to  7 per week for women and 14 servings for men.  Get adequate sleep . Wear seat belts . Don't text and drive .   Stay up to day on your mammogram and gyne pap check

## 2015-03-11 NOTE — Progress Notes (Signed)
Pre visit review using our clinic review tool, if applicable. No additional management support is needed unless otherwise documented below in the visit note.  Chief Complaint  Patient presents with  . Annual Exam  . Hypertension    HPI: Patient  Amanda Patterson  59 y.o. comes in today for Preventive Health Care visit    Cp eval by dr Radford Pax  Mary Sella  better after ppi protonix    Had worsening mvp and intensification  Of bp meds inc amlodipine to 5 mg   bp ok  No se of meds  Tole to take red yeast rice  Has ? About this  Not taking protonix except as needed not very often.  Has had bilat calf pain off and on since standing a long time in heels travels a lot . No injury no swelling  No hx of clots   Rash area left ankl;e no pain some itch ? Hcs?  Numbness very  tip o frightpinky no trauma not elswhere no pain or  Health Maintenance  Topic Date Due  . HIV Screening  03/08/2016 (Originally 11/21/1970)  . INFLUENZA VACCINE  06/09/2015  . PAP SMEAR  06/09/2015  . MAMMOGRAM  07/10/2016  . COLONOSCOPY  04/21/2020  . TETANUS/TDAP  02/28/2024   Health Maintenance Review LIFESTYLE:  Exercise:  Walks travels  Tobacco/ETS: no Alcohol:  no Sugar beverages:  Green tea Sleep: 8 hours  Drug use: no Colonoscopy: 2011 PAP: to get  MAMMO: to get one    ROS:  See above GEN/ HEENT: No fever, significant weight changes sweats headaches vision problems hearing changes, CV/ PULM; No chest pain shortness of breath cough, syncope,edema  change in exercise tolerance. GI /GU: No adominal pain, vomiting, change in bowel habits. No blood in the stool. No significant GU symptoms. SKIN/HEME: ,no acute skin rashes suspicious lesions or bleeding. No lymphadenopathy, nodules, masses.  NEURO/ PSYCH:  No neurologic signs such as weakness numbness. No depression anxiety. IMM/ Allergy: No unusual infections.  Allergy .   REST of 12 system review negative except as per HPI   Past Medical History    Diagnosis Date  . Chicken pox   . Headache(784.0)   . Murmur, heart     MVP by hx  stress echo 2012 midl post prolapse valve  . BP (high blood pressure)   . High cholesterol   . Hx of varicella     Past Surgical History  Procedure Laterality Date  . Cesarean section  [id number]  . Biopsy breast      Family History  Problem Relation Age of Onset  . Diabetes Mother   . Hypertension Mother   . Stroke Father   . Heart disease Paternal Grandmother   . Stroke Brother   . Heart attack Neg Hx   . Hypertension Father     History   Social History  . Marital Status: Married    Spouse Name: N/A  . Number of Children: N/A  . Years of Education: N/A   Social History Main Topics  . Smoking status: Never Smoker   . Smokeless tobacco: Not on file  . Alcohol Use: No  . Drug Use: Not on file  . Sexual Activity: Not on file   Other Topics Concern  . 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    Walking 5 day per week.                 Outpatient Prescriptions Prior to Visit  Medication Sig Dispense Refill  . amLODipine (NORVASC) 5 MG tablet Take 1 tablet (5 mg total) by mouth daily. 90 tablet 11  . Cholecalciferol (VITAMIN D3) 2000 UNITS TABS Take 1 tablet by mouth daily.    . nadolol (CORGARD) 20 MG tablet TAKE 1/2 TABLET BY MOUTH EVERY DAY 90 tablet 0  . pantoprazole (PROTONIX) 40 MG tablet Take 1 tablet (40 mg total) by mouth daily. 30 tablet 3   No facility-administered medications prior to visit.     EXAM:  BP 124/80 mmHg  Temp(Src) 98.1 F (36.7 C) (Oral)  Ht 5\' 2"  (1.575 m)  Wt 139 lb 11.2 oz (63.368 kg)  BMI 25.55 kg/m2  Body mass index is 25.55 kg/(m^2).  Physical Exam: Vital signs reviewed ZOX:WRUE is a well-developed  well-nourished alert cooperative    who appearsr stated age in no acute distress.  HEENT: normocephalic atraumatic , Eyes: PERRL EOM's full, conjunctiva clear, Nares: paten,t no deformity discharge or tenderness., Ears: no deformity EAC's clear TMs with normal landmarks. Mouth: clear OP, no lesions, edema.  Moist mucous membranes. Dentition in adequate repair. NECK: supple without masses, thyromegaly or bruits. CHEST/PULM:  Clear to auscultation and percussion breath sounds equal no wheeze , rales or rhonchi. No chest wall deformities or tenderness.Breast: normal by inspection . No dimpling, discharge, masses, tenderness or discharge . CV: PMI is nondisplaced, S1 S2 no gallops, murmurs, rubs. thre is a mid systolic click  Peripheral pulses are full without delay.No JVD .  ABDOMEN: Bowel sounds normal nontender  No guard or rebound, no hepato splenomegal no CVA tenderness.  No hernia. Extremtities:  No clubbing cyanosis or edema, no acute joint swelling or redness no focal atrophy neg homans no point tenderness  NEURO:  Oriented x3, cranial nerves 3-12 appear to be intact, no obvious focal weakness,gait within normal limits no abnormal reflexes or asymmetrical SKIN: No acute rashes normal turgor, color, no bruising or petechiae. Left ankle 1.5 cm scaly pink superficial  Rash  PSYCH: Oriented, good eye contact, no obvious depression anxiety, cognition and judgment appear normal. LN: no cervical axillary inguinal adenopathy  Lab Results  Component Value Date   WBC 6.5 02/24/2015   HGB 13.2 02/24/2015   HCT 39.5 02/24/2015   PLT 284.0 02/24/2015   GLUCOSE 103* 02/24/2015   CHOL 175 02/24/2015   TRIG 76.0 02/24/2015   HDL 45.70 02/24/2015   LDLDIRECT 145.4 08/21/2012   LDLCALC 114* 02/24/2015   ALT 22 02/24/2015   AST 18 02/24/2015   NA 141 02/24/2015   K 4.2 02/24/2015   CL 108 02/24/2015   CREATININE 0.82 02/24/2015   BUN 13 02/24/2015   CO2 27 02/24/2015   TSH 2.82 02/24/2015   HGBA1C  6.3 08/24/2013   BP Readings from Last 3 Encounters:  03/11/15 124/80  11/22/14 150/92  10/22/14 142/72   Wt Readings from Last 3 Encounters:  03/11/15 139 lb 11.2 oz (63.368 kg)  11/22/14 139 lb (63.05 kg)  10/22/14 138 lb (62.596 kg)    Echo mod diastolic dysfunction mild mr no prolapse noted  ASSESSMENT AND PLAN:  Discussed the following  assessment and plan:  Visit for preventive health examination  Essential hypertension  Hyperglycemia - checks  bg at home  under 100 fasting.  Calf pain  Bilateral after  Standing in heel loing time   travesl atypical of dvt but if  persistent or progressive get back with [place] . Can use hcs on leg .  Follow other issues  Patient Care Team: Burnis Medin, MD as PCP - General (Internal Medicine) Arvella Nigh, MD (Obstetrics and Gynecology) Lewie Loron, DC as Physician Assistant (Chiropractic Medicine) Sueanne Margarita, MD as Consulting Physician (Cardiology) Patient Instructions  Continue good blood pressure control. Calf pains prob from standing in heels walk flat shoes  Bit can try advil short term  but if  persistent or progressive progressive  Or swelling get back with [place] to reevaluate.     Healthy lifestyle includes : At least 150 minutes of exercise weeks  , weight at healthy levels, which is usually   BMI 19-25. Avoid trans fats and processed foods;  Increase fresh fruits and veges to 5 servings per day. And avoid sweet beverages including tea and juice. Mediterranean diet with olive oil and nuts have been noted to be heart and brain healthy . Avoid tobacco products . Limit  alcohol to  7 per week for women and 14 servings for men.  Get adequate sleep . Wear seat belts . Don't text and drive .   Stay up to day on your mammogram and gyne pap check     Standley Brooking. Loomis Anacker M.D.

## 2015-04-23 ENCOUNTER — Other Ambulatory Visit: Payer: Self-pay | Admitting: Obstetrics and Gynecology

## 2015-04-23 DIAGNOSIS — R921 Mammographic calcification found on diagnostic imaging of breast: Secondary | ICD-10-CM

## 2015-05-06 ENCOUNTER — Ambulatory Visit
Admission: RE | Admit: 2015-05-06 | Discharge: 2015-05-06 | Disposition: A | Discharge status: Home / Self Care | Payer: BLUE CROSS/BLUE SHIELD | Source: Ambulatory Visit | Attending: Obstetrics and Gynecology | Admitting: Obstetrics and Gynecology

## 2015-05-06 DIAGNOSIS — R921 Mammographic calcification found on diagnostic imaging of breast: Secondary | ICD-10-CM

## 2015-05-09 ENCOUNTER — Encounter: Payer: Self-pay | Admitting: Internal Medicine

## 2015-05-09 ENCOUNTER — Ambulatory Visit (INDEPENDENT_AMBULATORY_CARE_PROVIDER_SITE_OTHER): Payer: BLUE CROSS/BLUE SHIELD | Admitting: Internal Medicine

## 2015-05-09 DIAGNOSIS — R252 Cramp and spasm: Secondary | ICD-10-CM | POA: Diagnosis not present

## 2015-05-09 DIAGNOSIS — R202 Paresthesia of skin: Secondary | ICD-10-CM | POA: Diagnosis not present

## 2015-05-09 DIAGNOSIS — I10 Essential (primary) hypertension: Secondary | ICD-10-CM | POA: Diagnosis not present

## 2015-05-09 DIAGNOSIS — R200 Anesthesia of skin: Secondary | ICD-10-CM

## 2015-05-09 LAB — BASIC METABOLIC PANEL
BUN: 11 mg/dL (ref 6–23)
CALCIUM: 9.9 mg/dL (ref 8.4–10.5)
CO2: 30 mEq/L (ref 19–32)
Chloride: 104 mEq/L (ref 96–112)
Creatinine, Ser: 0.85 mg/dL (ref 0.40–1.20)
GFR: 88.51 mL/min (ref >60.00)
Glucose, Bld: 149 mg/dL — ABNORMAL HIGH (ref 70–99)
Potassium: 4.7 mEq/L (ref 3.5–5.1)
Sodium: 141 mEq/L (ref 135–145)

## 2015-05-09 LAB — MAGNESIUM: MAGNESIUM: 2.2 mg/dL (ref 1.5–2.5)

## 2015-05-09 NOTE — Patient Instructions (Signed)
Leg cramps could be from inactivity and  Dehydration. No evidence  Of blood clots  At this time .  Checking  Potassium and  Magnesium.   To make sure ok  The  Hand numbness   Could be from overuse  Watch .  elbow position and  Hand position when working.   Leg Cramps Leg cramps that occur during exercise can be caused by poor circulation or dehydration. However, muscle cramps that occur at rest or during the night are usually not due to any serious medical problem. Heat cramps may cause muscle spasms during hot weather.  CAUSES There is no clear cause for muscle cramps. However, dehydration may be a factor for those who do not drink enough fluids and those who exercise in the heat. Imbalances in the level of sodium, potassium, calcium or magnesium in the muscle tissue may also be a factor. Some medications, such as water pills (diuretics), may cause loss of chemicals that the body needs (like sodium and potassium) and cause muscle cramps. TREATMENT   Make sure your diet has enough fluids and essential minerals for the muscle to work normally.  Avoid strenuous exercise for several days if you have been having frequent leg cramps.  Stretch and massage the cramped muscle for several minutes.  Some medicines may be helpful in some patients with night cramps. Only take over-the-counter or prescription medicines as directed by your caregiver. SEEK IMMEDIATE MEDICAL CARE IF:   Your leg cramps become worse.  Your foot becomes cold, numb, or blue. Document Released: 12/02/2004 Document Revised: 01/17/2012 Document Reviewed: 11/19/2008 [facility] Patient Information 2015 [place], [place]. This information is not intended to replace advice given to you by your health care provider. Make sure you discuss any questions you have with your health care provider.

## 2015-05-09 NOTE — Progress Notes (Signed)
Pre visit review using our clinic review tool, if applicable. No additional management support is needed unless otherwise documented below in the visit note.   Chief Complaint  Patient presents with  . Bilateral Leg Cramps  . Bilateral Hand and Arm Numbness    HPI: Patient Amanda Patterson  comes in today for SDA for  new problem evaluation.   Luz Lex a lot with her husband air and conferences around country 2 days ago    Had cramps  In hotel room at [employer] after trip back from [place]   originally in her toes and then  kept reoccuring  And in calves and side sdies of legs   Used hoit water kep waking every hours with this.   Got home yesterday  And used epsoms salt  Soak and oral electolyte for adults   Slept last night .  And ok today  No swelling  Some sore feeling  Bilateral sx  No injury .  No gi illness new med   Was [place]  was [place]  previously then [place].    Right hand numbness little finger at times  No arm or neck with it no weakness  Does lots of keyboard work on these trips . No injury rest and night gets better.  ROS: See pertinent positives and negatives per HPI. No hx of clots  Legs down a lot at conference  Dose better when at home and active .  No cough cp sob   Past Medical History  Diagnosis Date  . Chicken pox   . Headache(784.0)   . Murmur, heart     MVP by hx  stress echo 2012 midl post prolapse valve  . BP (high blood pressure)   . High cholesterol   . Hx of varicella     Family History  Problem Relation Age of Onset  . Diabetes Mother   . Hypertension Mother   . Stroke Father   . Heart disease Paternal Grandmother   . Stroke Brother   . Heart attack Neg Hx   . Hypertension Father     History   Social History  . Marital Status: Married    Spouse Name: N/A  . Number of Children: N/A  . Years of Education: N/A   Social History Main Topics  . Smoking status: Never Smoker   . Smokeless tobacco: Not on file  . Alcohol Use: No  .  Drug Use: Not on file  . Sexual Activity: Not on file   Other Topics Concern  . 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    Walking 5 day per week.                 Outpatient Prescriptions Prior to Visit  Medication Sig Dispense Refill  . amLODipine (NORVASC) 5 MG tablet Take 1 tablet (5 mg total) by mouth daily. 90 tablet 11  . Cholecalciferol (VITAMIN D3) 2000 UNITS TABS Take 1 tablet by mouth daily.    . nadolol (CORGARD) 20 MG tablet TAKE 1/2 TABLET BY MOUTH EVERY DAY 90 tablet 0  . pantoprazole (  PROTONIX) 40 MG tablet Take 1 tablet (40 mg total) by mouth daily. (Patient not taking: Reported on 05/09/2015) 30 tablet 3   No facility-administered medications prior to visit.     EXAM:  BP 136/80 mmHg  Temp(Src) 98.6 F (37 C) (Oral)  Wt 138 lb (62.596 kg)  Body mass index is 25.23 kg/(m^2).  GENERAL: vitals reviewed and listed above, alert, oriented, appears well hydrated and in no acute distress HEENT: atraumatic, conjunctiva  clear, no obvious abnormalities on inspection of external nose and ears NECK: no obvious masses on inspection palpation  LUNGS: clear to auscultation bilaterally, no wheezes, rales or rhonchi, good air movement CV: HRRR, no clubbing cyanosis or  peripheral edema nl cap refill  Abdomen:  Sof,t normal bowel sounds without hepatosplenomegaly, no guarding rebound or masses no CVA tenderness MS: moves all extremities without noticeable focal  Abnormality no edema   Pulses nkl no atrophy neuro non focal gait nl looks well  PSYCH: pleasant and cooperative, no obvious depression or anxiety  ASSESSMENT AND PLAN:  Discussed the following assessment and plan:  Cramp of both lower extremities - nocturan l porb from inactiviy and hydration   no efidence of acute vascuar problem chech k and mg today - Plan: Basic metabolic panel, Magnesium  Numbness and tingling in right hand  Essential hypertension - controlled byhx  and repeat dsic adding compression stockings 10 - 20 with travel stretching  tonic water  Disc alarm sx for return  -Patient advised to return or notify health care team  if symptoms worsen ,persist or new concerns arise.  Patient Instructions  Leg cramps could be from inactivity and  Dehydration. No evidence  Of blood clots  At this time .  Checking  Potassium and  Magnesium.   To make sure ok  The  Hand numbness   Could be from overuse  Watch .  elbow position and  Hand position when working.   Leg Cramps Leg cramps that occur during exercise can be caused by poor circulation or dehydration. However, muscle cramps that occur at rest or during the night are usually not due to any serious medical problem. Heat cramps may cause muscle spasms during hot weather.  CAUSES There is no clear cause for muscle cramps. However, dehydration may be a factor for those who do not drink enough fluids and those who exercise in the heat. Imbalances in the level of sodium, potassium, calcium or magnesium in the muscle tissue may also be a factor. Some medications, such as water pills (diuretics), may cause loss of chemicals that the body needs (like sodium and potassium) and cause muscle cramps. TREATMENT   Make sure your diet has enough fluids and essential minerals for the muscle to work normally.  Avoid strenuous exercise for several days if you have been having frequent leg cramps.  Stretch and massage the cramped muscle for several minutes.  Some medicines may be helpful in some patients with night cramps. Only take over-the-counter or prescription medicines as directed by your caregiver. SEEK IMMEDIATE MEDICAL CARE IF:   Your leg cramps become worse.  Your foot becomes cold, numb, or blue. Document Released: 12/02/2004  Document Revised: 01/17/2012 Document Reviewed: 11/19/2008 [facility] Patient Information 2015 [place], [place]. This information is not intended to replace advice given to you by your health care provider. Make sure you discuss any questions you have with your health care provider.      Standley Brooking. Ciera Beckum M.D.

## 2015-06-30 ENCOUNTER — Other Ambulatory Visit: Payer: Self-pay | Admitting: Internal Medicine

## 2015-07-01 NOTE — Telephone Encounter (Signed)
Sent to the pharmacy by e-scribe. 

## 2015-07-29 ENCOUNTER — Telehealth: Payer: Self-pay | Admitting: Internal Medicine

## 2015-07-29 NOTE — Telephone Encounter (Signed)
Pt's luggage was delayed and did not arrive w/ pt. They say it will be a few days. All pt's meds were in her luggage. Pt wants to know if she can get 7 days of: nadolol (CORGARD) 20 MG tablet amLODipine (NORVASC) 5 MG tablet  [facility]

## 2015-07-29 NOTE — Telephone Encounter (Signed)
Yes  Please send in for her

## 2015-07-30 ENCOUNTER — Other Ambulatory Visit: Payer: Self-pay

## 2015-07-30 DIAGNOSIS — R0789 Other chest pain: Secondary | ICD-10-CM

## 2015-07-30 DIAGNOSIS — I341 Nonrheumatic mitral (valve) prolapse: Secondary | ICD-10-CM

## 2015-07-30 DIAGNOSIS — I340 Nonrheumatic mitral (valve) insufficiency: Secondary | ICD-10-CM

## 2015-07-30 MED ORDER — AMLODIPINE BESYLATE 5 MG PO TABS: 5.0000 mg | ORAL_TABLET | Freq: Every day | ORAL | Status: DC

## 2015-07-30 MED ORDER — NADOLOL 20 MG PO TABS: 10.0000 mg | ORAL_TABLET | Freq: Every day | ORAL | Status: DC

## 2015-07-30 NOTE — Telephone Encounter (Signed)
REFILL SENT INTO THE PHARMACY

## 2015-08-04 ENCOUNTER — Telehealth: Payer: Self-pay | Admitting: Internal Medicine

## 2015-08-04 NOTE — Telephone Encounter (Signed)
[facility] - [facility] [place]    --------------------------------------------------------------------------------   Patient Name: Amanda Patterson  Gender: Female  DOB: Aug 11, 1956   Age: 59 Y 73 M 12 D  Return Phone Number: [phone] (Primary), [phone] (Secondary)  Address: [address]    City/State/Zip: [place]  [address]   Client [facility] - Client  Client Site [facility] - Day  Physician Pan[place], [place] Type Call  Call Type Triage / Clinical  Relationship To Patient Self  Appointment Disposition EMR Patient Refused Appointment  Return Phone Number [phone] (Primary)  Chief Complaint Ear Fullness or Congestion  Initial Comment Caller states her ears are clogged up.  PreDisposition Home Care       Nurse Assessment  Nurse: Amalia Hailey, RN, Melissa Date/Time (Eastern Time): 08/04/2015 3:01:50 PM  Confirm and document reason for call. If symptomatic, describe symptoms. ---Caller states her ears are clogged up.    Has the patient traveled out of the country within the last 30 days? ---Not Applicable    Does the patient require triage? ---Yes    Related visit to physician within the last 2 weeks? ---No    Does the PT have any chronic conditions? (i.e. diabetes, asthma, etc.) ---Yes    List chronic conditions. ---hypertension           Guidelines          Guideline Title Affirmed Question Affirmed Notes Nurse Date/Time Eilene Ghazi Time)  Ear - Congestion Ear congestion present > 48 hours    Evans, RN, Melissa 08/04/2015 3:03:20 PM    Disp. Time Eilene Ghazi Time) Disposition Final User         08/04/2015 3:09:17 PM See PCP When Office is Open (within 3 days) Yes Amalia Hailey, RN, Lenna Sciara            Caller Understands: Yes  Disagree/Comply: Disagree  Disagree/Comply Reason: Unable to find transportation    Care Advice Given Per  Guideline        SEE PCP WITHIN 3 DAYS: * You need to be seen within 2 or 3 days. Call your doctor during regular office hours and make an appointment. An urgent care center is often the best source of care if your doctor's office is closed or you can't get an appointment. NOTE: If office will be open tomorrow, tell caller to call then, not in 3 days. TREATMENT - CHEWING AND SWALLOWING: * Try chewing gum. * You can also try swallowing water while pinching your nostrils closed. The reason this works is that it creates a small vacuum in the nose. This helps the eustachian tube to open up. * If chewing or swallowing doesn't help after 1 or 2 hours, you can try using an over-the-counter (OTC) nasal decongestant drops. You can use the nose drops twice a day. TREATMENT - DECONGESTANT NASAL SPRAY: * Oxymetazoline Nasal Drops (e.g., Afrin): Available OTC. Clean out the nose before using. Spray each nostril once, wait one minute for absorption, and then spray a second time. * Phenylephrine Nasal Drops (e.g., Neo-Synephrine): Available OTC. Clean out the nose before using. Spray each nostril once, wait one minute for absorption, and then spray a second time. * Before taking any medicine, read all the instructions on the package. TREATMENT - ANTIHISTAMINE MEDICATIONS FOR HAYFEVER: * If you have hay fever or other allergies, an antihistamine may be helpful. Antihistamines help reduce sneezing,  itching and runny nose. * You may need to take antihistamines continuously during pollen season (Reason: continuously is the key to control). * LORATADINE is a newer (second generation) antihistamine. The dosage of loratadine (e.g., OTC Claritin, Alavert) is 10 mg once a day. * CETIRIZINE is a newer (second generation) antihistamine. The dosage of cetirizine (e.g., OTC Zyrtec) is 10 mg once a day. * Loratadine and cetirizine cause less sleepiness than diphenhydramine (Benadryl) or Chlorpheniramine (Chlortrimeton, Chlor-tripolon). * Do  not use these medications for more than 3 days (Reason: rebound nasal congestion). CAUTION - NASAL DECONGESTANTS: * Examples include diphenhydramine (Benadryl) and chlorpheniramine (Chlortrimeton, Chlor-tripolon) * May cause sleepiness. Do not drink alcohol, drive or operate dangerous machinery while taking antihistamines. CALL BACK IF: * You become worse. * Severe ear pain occurs    After Care Instructions Given        Call Event Type User Date / Time Description        --------------------------------------------------------------------------------         Comments  User: Colin Ina, RN Date/Time (Eastern Time): 08/04/2015 3:10:14 PM  Caller reports she is flying out of town tomorrow morning to [place] to a family member's wedding and can not come into for appt. Caller given home care information.

## 2015-08-11 ENCOUNTER — Ambulatory Visit (INDEPENDENT_AMBULATORY_CARE_PROVIDER_SITE_OTHER): Payer: BLUE CROSS/BLUE SHIELD | Admitting: Internal Medicine

## 2015-08-11 ENCOUNTER — Encounter: Payer: Self-pay | Admitting: Internal Medicine

## 2015-08-11 VITALS — BP 132/80 | Temp 98.1°F | Wt 135.0 lb

## 2015-08-11 DIAGNOSIS — H6123 Impacted cerumen, bilateral: Secondary | ICD-10-CM | POA: Diagnosis not present

## 2015-08-11 DIAGNOSIS — R0981 Nasal congestion: Secondary | ICD-10-CM

## 2015-08-11 DIAGNOSIS — Z23 Encounter for immunization: Secondary | ICD-10-CM | POA: Diagnosis not present

## 2015-08-11 NOTE — Patient Instructions (Signed)
Ear exam looks normal .  For sinus  congestion  Advise try nasal cortisone 2 sprays each nostril each day for at least 1 week.  If improving then  Continue  Until better and then during season.  Could have some allergy causing some of the symptoms.

## 2015-08-11 NOTE — Progress Notes (Signed)
Pre visit review using our clinic review tool, if applicable. No additional management support is needed unless otherwise documented below in the visit note.  Chief Complaint  Patient presents with  . Cerumen Impaction    HPI: Patient Amanda Patterson  comes in today for SDA for  new problem evaluation. She has had decreased hearing over the last week. Was at her daughter's wedding in [place]. Has some sinus congestion over the mid face but no fever. Tried Afrin without help some postnasal drainage. No fever. Did fly had no pain in the ear during flight sent descent.  ROS: See pertinent positives and negatives per HPI.  Past Medical History  Diagnosis Date  . Chicken pox   . Headache(784.0)   . Murmur, heart     MVP by hx  stress echo 2012 midl post prolapse valve  . BP (high blood pressure)   . High cholesterol   . Hx of varicella     Family History  Problem Relation Age of Onset  . Diabetes Mother   . Hypertension Mother   . Stroke Father   . Heart disease Paternal Grandmother   . Stroke Brother   . Heart attack Neg Hx   . Hypertension Father     Social History   Social History  . Marital Status: Married    Spouse Name: N/A  . Number of Children: N/A  . Years of Education: N/A   Social History Main Topics  . Smoking status: Never Smoker   . Smokeless tobacco: None  . Alcohol Use: No  . Drug Use: None  . Sexual Activity: Not Asked   Other Topics Concern  . 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    Walking 5 day per week.                 Outpatient Prescriptions Prior to Visit  Medication Sig Dispense Refill  . amLODipine (NORVASC) 5 MG tablet Take 1 tablet (5 mg total) by mouth daily. 90 tablet 11  . Cholecalciferol (VITAMIN D3)  2000 UNITS TABS Take 1 tablet by mouth daily.    . nadolol (CORGARD) 20 MG tablet Take 0.5 tablets (10 mg total) by mouth daily. 90 tablet 0  . pantoprazole (PROTONIX) 40 MG tablet Take 1 tablet (40 mg total) by mouth daily. (Patient not taking: Reported on 05/09/2015) 30 tablet 3   No facility-administered medications prior to visit.     EXAM:  BP 132/80 mmHg  Temp(Src) 98.1 F (36.7 C) (Oral)  Wt 135 lb (61.236 kg)  Body mass index is 24.69 kg/(m^2).  GENERAL: vitals reviewed and listed above, alert, oriented, appears well hydrated and in no acute distress looks mildly allergic congested nontoxic HEENT: atraumatic, conjunctiva  clear, no obvious abnormalities on inspection of external nose and ears both ears irrigated impacted cerumen TMs intact normal landmarks nares slight congestion face nontender OP : no lesion edema or exudate  NECK: no obvious masses on inspection palpation   PSYCH: pleasant and cooperative, no obvious depression or anxiety   BP Readings from Last 3 Encounters:  08/11/15 132/80  05/09/15 136/80  03/11/15 124/80    ASSESSMENT AND PLAN:  Discussed the following assessment and plan:  Impacted ear wax, bilateral - Hearing better after removal.  Sinus congestion - Viral versus environmental allergic try nasal cortisone's.  Encounter for immunization - Flu vaccine okay today.  -Patient advised to return or notify health care team  if symptoms worsen ,persist or new concerns arise.  Patient Instructions  Ear exam looks normal .  For sinus  congestion  Advise try nasal cortisone 2 sprays each nostril each day for at least 1 week.  If improving then  Continue  Until better and then during season.  Could have some allergy causing some of the symptoms.        Standley Brooking. Panosh M.D.

## 2015-11-04 ENCOUNTER — Telehealth: Payer: Self-pay | Admitting: Internal Medicine

## 2015-11-04 MED ORDER — PANTOPRAZOLE SODIUM 40 MG PO TBEC: 40.0000 mg | DELAYED_RELEASE_TABLET | Freq: Every day | ORAL | Status: DC

## 2015-11-04 NOTE — Telephone Encounter (Signed)
Left a message notifying the pt to pick up rx in [place].  Advised she call back to schedule yearly OV for May 2017.

## 2015-11-04 NOTE — Telephone Encounter (Signed)
Patient requesting refill on pantoprazole (PROTONIX) 40 MG tablet  Pharmacy: [facility]  Patient is out of town until 11/07/2015 and wanted to know if the RX could be fill in [place]. Patient is aware that the refill can take up to 3 days.  Pharmacy: [facility] [place], [place] [address] [phone]

## 2015-12-09 ENCOUNTER — Telehealth: Payer: Self-pay | Admitting: Internal Medicine

## 2015-12-09 NOTE — Telephone Encounter (Signed)
Patient would like to know if she's up to date on her immunizations because she will be traveling to [place] in March.

## 2015-12-09 NOTE — Telephone Encounter (Signed)
Do not know what the pt has had without viewing her immunization history.  Have her drop off a copy if available.  Based only on our records, it looks like she needs the hep and b vaccine.  Please schedule her for that vaccine (Twinrix).

## 2015-12-09 NOTE — Telephone Encounter (Signed)
Talked to patient and relayed the message.  She said she is going to try and get her immunization records and she also asked about her husband's immunization record.  So I sent a phone note to [place] on him.

## 2015-12-15 ENCOUNTER — Ambulatory Visit (INDEPENDENT_AMBULATORY_CARE_PROVIDER_SITE_OTHER): Payer: BLUE CROSS/BLUE SHIELD | Admitting: Family Medicine

## 2015-12-15 ENCOUNTER — Other Ambulatory Visit: Payer: Self-pay | Admitting: Family Medicine

## 2015-12-15 ENCOUNTER — Ambulatory Visit: Payer: BLUE CROSS/BLUE SHIELD | Admitting: Family Medicine

## 2015-12-15 DIAGNOSIS — Z23 Encounter for immunization: Secondary | ICD-10-CM

## 2015-12-15 MED ORDER — TYPHOID VACCINE PO CPDR: 1.0000 | DELAYED_RELEASE_CAPSULE | ORAL | Status: DC

## 2016-01-08 ENCOUNTER — Telehealth: Payer: Self-pay | Admitting: Internal Medicine

## 2016-01-08 NOTE — Telephone Encounter (Signed)
Patient would like to know when to get her next Hep A & B  Inj. Please advise

## 2016-01-09 NOTE — Telephone Encounter (Signed)
Pt due 01/12/16 or after.  Please help her to make an appointment.

## 2016-01-09 NOTE — Telephone Encounter (Signed)
Pt has been scheduled.  °

## 2016-01-14 ENCOUNTER — Ambulatory Visit: Payer: BLUE CROSS/BLUE SHIELD | Admitting: Family Medicine

## 2016-01-20 ENCOUNTER — Ambulatory Visit (INDEPENDENT_AMBULATORY_CARE_PROVIDER_SITE_OTHER): Payer: BLUE CROSS/BLUE SHIELD | Admitting: Family Medicine

## 2016-01-20 ENCOUNTER — Ambulatory Visit: Payer: BLUE CROSS/BLUE SHIELD | Admitting: Family Medicine

## 2016-01-20 ENCOUNTER — Other Ambulatory Visit: Payer: Self-pay | Admitting: Family Medicine

## 2016-01-20 DIAGNOSIS — Z23 Encounter for immunization: Secondary | ICD-10-CM | POA: Diagnosis not present

## 2016-01-20 NOTE — Telephone Encounter (Signed)
Looks like your pt

## 2016-01-20 NOTE — Telephone Encounter (Signed)
Sent to the pharmacy by e-scribe.  Pt has upcoming appt on 03/17/16

## 2016-03-01 ENCOUNTER — Other Ambulatory Visit: Payer: Self-pay | Admitting: Internal Medicine

## 2016-03-02 NOTE — Telephone Encounter (Signed)
Sent to the pharmacy by e-scribe.  Pt has upcoming appt on 03/17/16

## 2016-03-03 ENCOUNTER — Other Ambulatory Visit (INDEPENDENT_AMBULATORY_CARE_PROVIDER_SITE_OTHER): Payer: BLUE CROSS/BLUE SHIELD

## 2016-03-03 DIAGNOSIS — Z0000 Encounter for general adult medical examination without abnormal findings: Secondary | ICD-10-CM

## 2016-03-03 LAB — CBC WITH DIFFERENTIAL/PLATELET
Basophils Absolute: 0 10*3/uL (ref 0.0–0.1)
Basophils Relative: 0.4 % (ref 0.0–3.0)
EOS PCT: 1.5 % (ref 0.0–5.0)
Eosinophils Absolute: 0.1 10*3/uL (ref 0.0–0.7)
HCT: 40.8 % (ref 36.0–46.0)
Hemoglobin: 13.7 g/dL (ref 12.0–15.0)
LYMPHS ABS: 1.9 10*3/uL (ref 0.7–4.0)
Lymphocytes Relative: 29.6 % (ref 12.0–46.0)
MCHC: 33.5 g/dL (ref 30.0–36.0)
MCV: 85.4 fl (ref 78.0–100.0)
MONO ABS: 0.4 10*3/uL (ref 0.1–1.0)
Monocytes Relative: 6 % (ref 3.0–12.0)
NEUTROS ABS: 4.1 10*3/uL (ref 1.4–7.7)
NEUTROS PCT: 62.5 % (ref 43.0–77.0)
PLATELETS: 279 10*3/uL (ref 150.0–400.0)
RBC: 4.78 Mil/uL (ref 3.87–5.11)
RDW: 14.6 % (ref 11.5–15.5)
WBC: 6.5 10*3/uL (ref 4.0–10.5)

## 2016-03-03 LAB — BASIC METABOLIC PANEL
BUN: 14 mg/dL (ref 6–23)
CHLORIDE: 106 meq/L (ref 96–112)
CO2: 30 meq/L (ref 19–32)
Calcium: 9.4 mg/dL (ref 8.4–10.5)
Creatinine, Ser: 0.87 mg/dL (ref 0.40–1.20)
GFR: 85.92 mL/min (ref >60.00)
GLUCOSE: 115 mg/dL — AB (ref 70–99)
POTASSIUM: 4.3 meq/L (ref 3.5–5.1)
SODIUM: 142 meq/L (ref 135–145)

## 2016-03-03 LAB — HEPATIC FUNCTION PANEL
ALBUMIN: 4.2 g/dL (ref 3.5–5.2)
ALT: 20 U/L (ref 0–35)
AST: 17 U/L (ref 0–37)
Alkaline Phosphatase: 50 U/L (ref 39–117)
BILIRUBIN TOTAL: 0.5 mg/dL (ref 0.2–1.2)
Bilirubin, Direct: 0.1 mg/dL (ref 0.0–0.3)
Total Protein: 7 g/dL (ref 6.0–8.3)

## 2016-03-03 LAB — LIPID PANEL
CHOLESTEROL: 198 mg/dL (ref 0–200)
HDL: 45.7 mg/dL (ref >39.00)
LDL CALC: 134 mg/dL — AB (ref 0–99)
NonHDL: 152.54
TRIGLYCERIDES: 93 mg/dL (ref 0.0–149.0)
Total CHOL/HDL Ratio: 4
VLDL: 18.6 mg/dL (ref 0.0–40.0)

## 2016-03-03 LAB — TSH: TSH: 1.47 u[IU]/mL (ref 0.35–4.50)

## 2016-03-10 ENCOUNTER — Other Ambulatory Visit: Payer: BLUE CROSS/BLUE SHIELD

## 2016-03-16 NOTE — Progress Notes (Signed)
Chief Complaint  Patient presents with  . Annual Exam    last pap was a year ago.     HPI: Patient  Amanda Patterson  60 y.o. comes in today for Preventive Health Care visit  bp   Good on 2 meds  corgarcd for palpititaions from mvp protonix  Only when needed.  Only ocass use.  Trying to stay active cut out sugars  fam hx dm  Health Maintenance  Topic Date Due  . Hepatitis C Screening  12-29-1955  . HIV Screening  11/21/1972  . PAP SMEAR  06/09/2015  . INFLUENZA VACCINE  06/08/2016  . MAMMOGRAM  05/05/2017  . COLONOSCOPY  04/21/2020  . TETANUS/TDAP  02/28/2024   Health Maintenance Review LIFESTYLE:  Exercise:   Sometimes  Walking  Conferences  Up.  Travels  Tobacco/ETS: no    Alcohol:   no Sugar beverages: green tea and water  Sleep:   Try to get 8 hours   Usually 6-7  Drug use: no Has gyne utd on pap    ROS:  GEN/ HEENT: No fever, significant weight changes sweats headaches vision problems hearing changes, CV/ PULM; No chest pain shortness of breath cough, syncope,edema  change in exercise tolerance. GI /GU: No adominal pain, vomiting, change in bowel habits. No blood in the stool. No significant GU symptoms. SKIN/HEME: ,no acute skin rashes suspicious lesions or bleeding. No lymphadenopathy, nodules, masses.  NEURO/ PSYCH:  No neurologic signs such as weakness numbness. No depression anxiety. IMM/ Allergy: No unusual infections.  Allergy .   REST of 12 system review negative except as per HPI   Past Medical History  Diagnosis Date  . Chicken pox   . Headache(784.0)   . Murmur, heart     MVP by hx  stress echo 2012 midl post prolapse valve  . BP (high blood pressure)   . High cholesterol   . Hx of varicella     Past Surgical History  Procedure Laterality Date  . Cesarean section  [id number]  . Biopsy breast      Family History  Problem Relation Age of Onset  . Diabetes Mother   . Hypertension Mother   . Stroke Father   . Heart disease Paternal  Grandmother   . Stroke Brother   . Heart attack Neg Hx   . Hypertension Father     Social History   Social History  . Marital Status: Married    Spouse Name: N/A  . Number of Children: N/A  . Years of Education: N/A   Social History Main Topics  . Smoking status: Never Smoker   . Smokeless tobacco: None  . Alcohol Use: No  . Drug Use: None  . Sexual Activity: Not Asked   Other Topics Concern  . 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    Walking 5 day per week.    Now  [place] for a few years still has residence in [place] Prior to Visit  Medication Sig Dispense  Refill  . amLODipine (NORVASC) 5 MG tablet Take 1 tablet (5 mg total) by mouth daily. 90 tablet 11  . Cholecalciferol (VITAMIN D3) 2000 UNITS TABS Take 1 tablet by mouth daily.    . nadolol (CORGARD) 20 MG tablet TAKE 1/2 TABLET DAILY 90 tablet 0  . pantoprazole (PROTONIX) 40 MG tablet Take 1 tablet (40 mg total) by mouth daily. 90 tablet 0  . pantoprazole (PROTONIX) 40 MG tablet TAKE 1 TABLET BY MOUTH EVERY DAY 30 tablet 1  . typhoid (VIVOTIF) DR capsule Take 1 capsule by mouth every other day. (Patient not taking: Reported on 03/17/2016) 4 capsule 0   No facility-administered medications prior to visit.     EXAM:  BP 120/70 mmHg  Pulse 73  Ht 5' 1.75" (1.568 m)  Wt 137 lb 3.2 oz (62.234 kg)  BMI 25.31 kg/m2  SpO2 98%  Body mass index is 25.31 kg/(m^2).  Physical Exam: Vital signs reviewed ZOX:WRUE is a well-developed well-nourished alert cooperative    who appearsr stated age in no acute distress.  HEENT: normocephalic atraumatic , Eyes: PERRL EOM's full, conjunctiva clear, Nares: paten,t no deformity discharge or  tenderness., Ears: no deformity EAC's clear TMs with normal landmarks. Mouth: clear OP, no lesions, edema.  Moist mucous membranes. Dentition in adequate repair. NECK: supple without masses, thyromegaly or bruits. CHEST/PULM:  Clear to auscultation and percussion breath sounds equal no wheeze , rales or rhonchi. No chest wall deformities or tenderness.Breast: normal by inspection . No dimpling, discharge, masses, tenderness or discharge . CV: PMI is nondisplaced, S1 S2 no gallops, murmurs, rubs. Peripheral pulses are full without delay.No JVD .  ABDOMEN: Bowel sounds normal nontender  No guard or rebound, no hepato splenomegal no CVA tenderness.  No hernia. Extremtities:  No clubbing cyanosis or edema, no acute joint swelling or redness no focal atrophy NEURO:  Oriented x3, cranial nerves 3-12 appear to be intact, no obvious focal weakness,gait within normal limits no abnormal reflexes or asymmetrical SKIN: No acute rashes normal turgor, color, no bruising or petechiae. PSYCH: Oriented, good eye contact, no obvious depression anxiety, cognition and judgment appear normal. LN: no cervical axillary inguinal adenopathy  Lab Results  Component Value Date   WBC 6.5 03/03/2016   HGB 13.7 03/03/2016   HCT 40.8 03/03/2016   PLT 279.0 03/03/2016   GLUCOSE 115* 03/03/2016   CHOL 198 03/03/2016   TRIG 93.0 03/03/2016   HDL 45.70 03/03/2016   LDLDIRECT 145.4 08/21/2012   LDLCALC 134* 03/03/2016   ALT 20 03/03/2016   AST 17 03/03/2016   NA 142 03/03/2016   K 4.3 03/03/2016   CL 106 03/03/2016   CREATININE 0.87 03/03/2016   BUN 14 03/03/2016   CO2 30 03/03/2016   TSH 1.47 03/03/2016   HGBA1C 5.9 03/17/2016    ASSESSMENT AND PLAN:  Discussed the following assessment and plan:  Visit for preventive health examination  Hyperglycemia - high normla range  5.9 contisnue lsi and follow disc metformin if needed - Plan: POCT A1C  Essential hypertension - controlled  MVP (mitral valve  prolapse)  Patient Care Team: Burnis Medin, MD as PCP - General (Internal Medicine) Arvella Nigh, MD (Obstetrics and Gynecology) Lewie Loron, [place] as Physician Assistant (Chiropractic Medicine) Sueanne Margarita, MD as Consulting Physician (Cardiology) Patient Instructions  Continue lifestyle intervention healthy eating and exercise . To avoid getting diabetes  .  Get heart exam and labs every year .    Health Maintenance, Female Adopting a healthy  lifestyle and getting preventive care can go a long way to promote health and wellness. Talk with your health care provider about what schedule of regular examinations is right for you. This is a good chance for you to check in with your provider about disease prevention and staying healthy. In between checkups, there are plenty of things you can do on your own. Experts have done a lot of research about which lifestyle changes and preventive measures are most likely to keep you healthy. Ask your health care provider for more information. WEIGHT AND DIET  Eat a healthy diet  Be sure to include plenty of vegetables, fruits, low-fat dairy products, and lean protein.  Do not eat a lot of foods high in solid fats, added sugars, or salt.  Get regular exercise. This is one of the most important things you can do for your health.  Most adults should exercise for at least 150 minutes each week. The exercise should increase your heart rate and make you sweat (moderate-intensity exercise).  Most adults should also do strengthening exercises at least twice a week. This is in addition to the moderate-intensity exercise.  Maintain a healthy weight  Body mass index (BMI) is a measurement that can be used to identify possible weight problems. It estimates body fat based on height and weight. Your health care provider can help determine your BMI and help you achieve or maintain a healthy weight.  For females 41 years of age and older:   A BMI below 18.5  is considered underweight.  A BMI of 18.5 to 24.9 is normal.  A BMI of 25 to 29.9 is considered overweight.  A BMI of 30 and above is considered obese.  Watch levels of cholesterol and blood lipids  You should start having your blood tested for lipids and cholesterol at 58 years of age, then have this test every 5 years.  You may need to have your cholesterol levels checked more often if:  Your lipid or cholesterol levels are high.  You are older than 58 years of age.  You are at high risk for heart disease.  CANCER SCREENING   Lung Cancer  Lung cancer screening is recommended for adults 52-37 years old who are at high risk for lung cancer because of a history of smoking.  A yearly low-dose CT scan of the lungs is recommended for people who:  Currently smoke.  Have quit within the past 15 years.  Have at least a 30-pack-year history of smoking. A pack year is smoking an average of one pack of cigarettes a day for 1 year.  Yearly screening should continue until it has been 15 years since you quit.  Yearly screening should stop if you develop a health problem that would prevent you from having lung cancer treatment.  Breast Cancer  Practice breast self-awareness. This means understanding how your breasts normally appear and feel.  It also means doing regular breast self-exams. Let your health care provider know about any changes, no matter how small.  If you are in your 20s or 30s, you should have a clinical breast exam (CBE) by a health care provider every 1-3 years as part of a regular health exam.  If you are 36 or older, have a CBE every year. Also consider having a breast X-ray (mammogram) every year.  If you have a family history of breast cancer, talk to your health care provider about genetic screening.  If you are at high risk for breast  cancer, talk to your health care provider about having an MRI and a mammogram every year.  Breast cancer gene (BRCA)  assessment is recommended for women who have family members with BRCA-related cancers. BRCA-related cancers include:  Breast.  Ovarian.  Tubal.  Peritoneal cancers.  Results of the assessment will determine the need for genetic counseling and BRCA1 and BRCA2 testing. Cervical Cancer Your health care provider may recommend that you be screened regularly for cancer of the pelvic organs (ovaries, uterus, and vagina). This screening involves a pelvic examination, including checking for microscopic changes to the surface of your cervix (Pap test). You may be encouraged to have this screening done every 3 years, beginning at age 62.  For women ages 53-65, health care providers may recommend pelvic exams and Pap testing every 3 years, or they may recommend the Pap and pelvic exam, combined with testing for human papilloma virus (HPV), every 5 years. Some types of HPV increase your risk of cervical cancer. Testing for HPV may also be done on women of any age with unclear Pap test results.  Other health care providers may not recommend any screening for nonpregnant women who are considered low risk for pelvic cancer and who do not have symptoms. Ask your health care provider if a screening pelvic exam is right for you.  If you have had past treatment for cervical cancer or a condition that could lead to cancer, you need Pap tests and screening for cancer for at least 20 years after your treatment. If Pap tests have been discontinued, your risk factors (such as having a new sexual partner) need to be reassessed to determine if screening should resume. Some women have medical problems that increase the chance of getting cervical cancer. In these cases, your health care provider may recommend more frequent screening and Pap tests. Colorectal Cancer  This type of cancer can be detected and often prevented.  Routine colorectal cancer screening usually begins at 58 years of age and continues through 58 years  of age.  Your health care provider may recommend screening at an earlier age if you have risk factors for colon cancer.  Your health care provider may also recommend using home test kits to check for hidden blood in the stool.  A small camera at the end of a tube can be used to examine your colon directly (sigmoidoscopy or colonoscopy). This is done to check for the earliest forms of colorectal cancer.  Routine screening usually begins at age 35.  Direct examination of the colon should be repeated every 5-10 years through 58 years of age. However, you may need to be screened more often if early forms of precancerous polyps or small growths are found. Skin Cancer  Check your skin from head to toe regularly.  Tell your health care provider about any new moles or changes in moles, especially if there is a change in a mole's shape or color.  Also tell your health care provider if you have a mole that is larger than the size of a pencil eraser.  Always use sunscreen. Apply sunscreen liberally and repeatedly throughout the day.  Protect yourself by wearing long sleeves, pants, a wide-brimmed hat, and sunglasses whenever you are outside. HEART DISEASE, DIABETES, AND HIGH BLOOD PRESSURE   High blood pressure causes heart disease and increases the risk of stroke. High blood pressure is more likely to develop in:  People who have blood pressure in the high end of the normal range (130-139/85-89 mm  Hg).  People who are overweight or obese.  People who are African American.  If you are 65-51 years of age, have your blood pressure checked every 3-5 years. If you are 9 years of age or older, have your blood pressure checked every year. You should have your blood pressure measured twice--once when you are at a hospital or clinic, and once when you are not at a hospital or clinic. Record the average of the two measurements. To check your blood pressure when you are not at a hospital or clinic, you  can use:  An automated blood pressure machine at a pharmacy.  A home blood pressure monitor.  If you are between 27 years and 48 years old, ask your health care provider if you should take aspirin to prevent strokes.  Have regular diabetes screenings. This involves taking a blood sample to check your fasting blood sugar level.  If you are at a normal weight and have a low risk for diabetes, have this test once every three years after 58 years of age.  If you are overweight and have a high risk for diabetes, consider being tested at a younger age or more often. PREVENTING INFECTION  Hepatitis B  If you have a higher risk for hepatitis B, you should be screened for this virus. You are considered at high risk for hepatitis B if:  You were born in a country where hepatitis B is common. Ask your health care provider which countries are considered high risk.  Your parents were born in a high-risk country, and you have not been immunized against hepatitis B (hepatitis B vaccine).  You have HIV or AIDS.  You use needles to inject street drugs.  You live with someone who has hepatitis B.  You have had sex with someone who has hepatitis B.  You get hemodialysis treatment.  You take certain medicines for conditions, including cancer, organ transplantation, and autoimmune conditions. Hepatitis C  Blood testing is recommended for:  Everyone born from [birth year] through 1963.  Anyone with known risk factors for hepatitis C. Sexually transmitted infections (STIs)  You should be screened for sexually transmitted infections (STIs) including gonorrhea and chlamydia if:  You are sexually active and are younger than 58 years of age.  You are older than 58 years of age and your health care provider tells you that you are at risk for this type of infection.  Your sexual activity has changed since you were last screened and you are at an increased risk for chlamydia or gonorrhea. Ask your health  care provider if you are at risk.  If you do not have HIV, but are at risk, it may be recommended that you take a prescription medicine daily to prevent HIV infection. This is called pre-exposure prophylaxis (PrEP). You are considered at risk if:  You are sexually active and do not regularly use condoms or know the HIV status of your partner(s).  You take drugs by injection.  You are sexually active with a partner who has HIV. Talk with your health care provider about whether you are at high risk of being infected with HIV. If you choose to begin PrEP, you should first be tested for HIV. You should then be tested every 3 months for as long as you are taking PrEP.  PREGNANCY   If you are premenopausal and you may become pregnant, ask your health care provider about preconception counseling.  If you may become pregnant, take 400 to 800  micrograms (mcg) of folic acid every day.  If you want to prevent pregnancy, talk to your health care provider about birth control (contraception). OSTEOPOROSIS AND MENOPAUSE   Osteoporosis is a disease in which the bones lose minerals and strength with aging. This can result in serious bone fractures. Your risk for osteoporosis can be identified using a bone density scan.  If you are 60 years of age or older, or if you are at risk for osteoporosis and fractures, ask your health care provider if you should be screened.  Ask your health care provider whether you should take a calcium or vitamin D supplement to lower your risk for osteoporosis.  Menopause may have certain physical symptoms and risks.  Hormone replacement therapy may reduce some of these symptoms and risks. Talk to your health care provider about whether hormone replacement therapy is right for you.  HOME CARE INSTRUCTIONS   Schedule regular health, dental, and eye exams.  Stay current with your immunizations.   Do not use any tobacco products including cigarettes, chewing tobacco, or  electronic cigarettes.  If you are pregnant, do not drink alcohol.  If you are breastfeeding, limit how much and how often you drink alcohol.  Limit alcohol intake to no more than 1 drink per day for nonpregnant women. One drink equals 12 ounces of beer, 5 ounces of wine, or 1 ounces of hard liquor.  Do not use street drugs.  Do not share needles.  Ask your health care provider for help if you need support or information about quitting drugs.  Tell your health care provider if you often feel depressed.  Tell your health care provider if you have ever been abused or do not feel safe at home.   This information is not intended to replace advice given to you by your health care provider. Make sure you discuss any questions you have with your health care provider.   Document Released: 05/10/2011 Document Revised: 11/15/2014 Document Reviewed: 09/26/2013 Elsevier Interactive Patient Education 2016 Varnell K. Panosh M.D.

## 2016-03-17 ENCOUNTER — Ambulatory Visit (INDEPENDENT_AMBULATORY_CARE_PROVIDER_SITE_OTHER): Payer: BLUE CROSS/BLUE SHIELD | Admitting: Internal Medicine

## 2016-03-17 ENCOUNTER — Encounter: Payer: Self-pay | Admitting: Internal Medicine

## 2016-03-17 VITALS — BP 120/70 | HR 73 | Ht 61.75 in | Wt 137.2 lb

## 2016-03-17 DIAGNOSIS — I341 Nonrheumatic mitral (valve) prolapse: Secondary | ICD-10-CM | POA: Diagnosis not present

## 2016-03-17 DIAGNOSIS — R739 Hyperglycemia, unspecified: Secondary | ICD-10-CM | POA: Diagnosis not present

## 2016-03-17 DIAGNOSIS — Z0000 Encounter for general adult medical examination without abnormal findings: Secondary | ICD-10-CM | POA: Diagnosis not present

## 2016-03-17 DIAGNOSIS — I10 Essential (primary) hypertension: Secondary | ICD-10-CM | POA: Diagnosis not present

## 2016-03-17 LAB — POCT GLYCOSYLATED HEMOGLOBIN (HGB A1C): Hemoglobin A1C: 5.9

## 2016-03-17 NOTE — Patient Instructions (Addendum)
Continue lifestyle intervention healthy eating and exercise . To avoid getting diabetes  .  Get heart exam and labs every year .    Health Maintenance, Female Adopting a healthy lifestyle and getting preventive care can go a long way to promote health and wellness. Talk with your health care provider about what schedule of regular examinations is right for you. This is a good chance for you to check in with your provider about disease prevention and staying healthy. In between checkups, there are plenty of things you can do on your own. Experts have done a lot of research about which lifestyle changes and preventive measures are most likely to keep you healthy. Ask your health care provider for more information. WEIGHT AND DIET  Eat a healthy diet  Be sure to include plenty of vegetables, fruits, low-fat dairy products, and lean protein.  Do not eat a lot of foods high in solid fats, added sugars, or salt.  Get regular exercise. This is one of the most important things you can do for your health.  Most adults should exercise for at least 150 minutes each week. The exercise should increase your heart rate and make you sweat (moderate-intensity exercise).  Most adults should also do strengthening exercises at least twice a week. This is in addition to the moderate-intensity exercise.  Maintain a healthy weight  Body mass index (BMI) is a measurement that can be used to identify possible weight problems. It estimates body fat based on height and weight. Your health care provider can help determine your BMI and help you achieve or maintain a healthy weight.  For females 11 years of age and older:   A BMI below 18.5 is considered underweight.  A BMI of 18.5 to 24.9 is normal.  A BMI of 25 to 29.9 is considered overweight.  A BMI of 30 and above is considered obese.  Watch levels of cholesterol and blood lipids  You should start having your blood tested for lipids and cholesterol at  58 years of age, then have this test every 5 years.  You may need to have your cholesterol levels checked more often if:  Your lipid or cholesterol levels are high.  You are older than 58 years of age.  You are at high risk for heart disease.  CANCER SCREENING   Lung Cancer  Lung cancer screening is recommended for adults 4-20 years old who are at high risk for lung cancer because of a history of smoking.  A yearly low-dose CT scan of the lungs is recommended for people who:  Currently smoke.  Have quit within the past 15 years.  Have at least a 30-pack-year history of smoking. A pack year is smoking an average of one pack of cigarettes a day for 1 year.  Yearly screening should continue until it has been 15 years since you quit.  Yearly screening should stop if you develop a health problem that would prevent you from having lung cancer treatment.  Breast Cancer  Practice breast self-awareness. This means understanding how your breasts normally appear and feel.  It also means doing regular breast self-exams. Let your health care provider know about any changes, no matter how small.  If you are in your 20s or 30s, you should have a clinical breast exam (CBE) by a health care provider every 1-3 years as part of a regular health exam.  If you are 39 or older, have a CBE every year. Also consider having a breast  X-ray (mammogram) every year.  If you have a family history of breast cancer, talk to your health care provider about genetic screening.  If you are at high risk for breast cancer, talk to your health care provider about having an MRI and a mammogram every year.  Breast cancer gene (BRCA) assessment is recommended for women who have family members with BRCA-related cancers. BRCA-related cancers include:  Breast.  Ovarian.  Tubal.  Peritoneal cancers.  Results of the assessment will determine the need for genetic counseling and BRCA1 and BRCA2  testing. Cervical Cancer Your health care provider may recommend that you be screened regularly for cancer of the pelvic organs (ovaries, uterus, and vagina). This screening involves a pelvic examination, including checking for microscopic changes to the surface of your cervix (Pap test). You may be encouraged to have this screening done every 3 years, beginning at age 44.  For women ages 59-65, health care providers may recommend pelvic exams and Pap testing every 3 years, or they may recommend the Pap and pelvic exam, combined with testing for human papilloma virus (HPV), every 5 years. Some types of HPV increase your risk of cervical cancer. Testing for HPV may also be done on women of any age with unclear Pap test results.  Other health care providers may not recommend any screening for nonpregnant women who are considered low risk for pelvic cancer and who do not have symptoms. Ask your health care provider if a screening pelvic exam is right for you.  If you have had past treatment for cervical cancer or a condition that could lead to cancer, you need Pap tests and screening for cancer for at least 20 years after your treatment. If Pap tests have been discontinued, your risk factors (such as having a new sexual partner) need to be reassessed to determine if screening should resume. Some women have medical problems that increase the chance of getting cervical cancer. In these cases, your health care provider may recommend more frequent screening and Pap tests. Colorectal Cancer  This type of cancer can be detected and often prevented.  Routine colorectal cancer screening usually begins at 58 years of age and continues through 58 years of age.  Your health care provider may recommend screening at an earlier age if you have risk factors for colon cancer.  Your health care provider may also recommend using home test kits to check for hidden blood in the stool.  A small camera at the end of a  tube can be used to examine your colon directly (sigmoidoscopy or colonoscopy). This is done to check for the earliest forms of colorectal cancer.  Routine screening usually begins at age 39.  Direct examination of the colon should be repeated every 5-10 years through 58 years of age. However, you may need to be screened more often if early forms of precancerous polyps or small growths are found. Skin Cancer  Check your skin from head to toe regularly.  Tell your health care provider about any new moles or changes in moles, especially if there is a change in a mole's shape or color.  Also tell your health care provider if you have a mole that is larger than the size of a pencil eraser.  Always use sunscreen. Apply sunscreen liberally and repeatedly throughout the day.  Protect yourself by wearing long sleeves, pants, a wide-brimmed hat, and sunglasses whenever you are outside. HEART DISEASE, DIABETES, AND HIGH BLOOD PRESSURE   High blood pressure causes heart  disease and increases the risk of stroke. High blood pressure is more likely to develop in:  People who have blood pressure in the high end of the normal range (130-139/85-89 mm Hg).  People who are overweight or obese.  People who are African American.  If you are 71-41 years of age, have your blood pressure checked every 3-5 years. If you are 58 years of age or older, have your blood pressure checked every year. You should have your blood pressure measured twice--once when you are at a hospital or clinic, and once when you are not at a hospital or clinic. Record the average of the two measurements. To check your blood pressure when you are not at a hospital or clinic, you can use:  An automated blood pressure machine at a pharmacy.  A home blood pressure monitor.  If you are between 61 years and 78 years old, ask your health care provider if you should take aspirin to prevent strokes.  Have regular diabetes screenings. This  involves taking a blood sample to check your fasting blood sugar level.  If you are at a normal weight and have a low risk for diabetes, have this test once every three years after 58 years of age.  If you are overweight and have a high risk for diabetes, consider being tested at a younger age or more often. PREVENTING INFECTION  Hepatitis B  If you have a higher risk for hepatitis B, you should be screened for this virus. You are considered at high risk for hepatitis B if:  You were born in a country where hepatitis B is common. Ask your health care provider which countries are considered high risk.  Your parents were born in a high-risk country, and you have not been immunized against hepatitis B (hepatitis B vaccine).  You have HIV or AIDS.  You use needles to inject street drugs.  You live with someone who has hepatitis B.  You have had sex with someone who has hepatitis B.  You get hemodialysis treatment.  You take certain medicines for conditions, including cancer, organ transplantation, and autoimmune conditions. Hepatitis C  Blood testing is recommended for:  Everyone born from [birth year] through 1963.  Anyone with known risk factors for hepatitis C. Sexually transmitted infections (STIs)  You should be screened for sexually transmitted infections (STIs) including gonorrhea and chlamydia if:  You are sexually active and are younger than 58 years of age.  You are older than 58 years of age and your health care provider tells you that you are at risk for this type of infection.  Your sexual activity has changed since you were last screened and you are at an increased risk for chlamydia or gonorrhea. Ask your health care provider if you are at risk.  If you do not have HIV, but are at risk, it may be recommended that you take a prescription medicine daily to prevent HIV infection. This is called pre-exposure prophylaxis (PrEP). You are considered at risk if:  You are  sexually active and do not regularly use condoms or know the HIV status of your partner(s).  You take drugs by injection.  You are sexually active with a partner who has HIV. Talk with your health care provider about whether you are at high risk of being infected with HIV. If you choose to begin PrEP, you should first be tested for HIV. You should then be tested every 3 months for as long as you are taking  PrEP.  PREGNANCY   If you are premenopausal and you may become pregnant, ask your health care provider about preconception counseling.  If you may become pregnant, take 400 to 800 micrograms (mcg) of folic acid every day.  If you want to prevent pregnancy, talk to your health care provider about birth control (contraception). OSTEOPOROSIS AND MENOPAUSE   Osteoporosis is a disease in which the bones lose minerals and strength with aging. This can result in serious bone fractures. Your risk for osteoporosis can be identified using a bone density scan.  If you are 65 years of age or older, or if you are at risk for osteoporosis and fractures, ask your health care provider if you should be screened.  Ask your health care provider whether you should take a calcium or vitamin D supplement to lower your risk for osteoporosis.  Menopause may have certain physical symptoms and risks.  Hormone replacement therapy may reduce some of these symptoms and risks. Talk to your health care provider about whether hormone replacement therapy is right for you.  HOME CARE INSTRUCTIONS   Schedule regular health, dental, and eye exams.  Stay current with your immunizations.   Do not use any tobacco products including cigarettes, chewing tobacco, or electronic cigarettes.  If you are pregnant, do not drink alcohol.  If you are breastfeeding, limit how much and how often you drink alcohol.  Limit alcohol intake to no more than 1 drink per day for nonpregnant women. One drink equals 12 ounces of beer, 5  ounces of wine, or 1 ounces of hard liquor.  Do not use street drugs.  Do not share needles.  Ask your health care provider for help if you need support or information about quitting drugs.  Tell your health care provider if you often feel depressed.  Tell your health care provider if you have ever been abused or do not feel safe at home.   This information is not intended to replace advice given to you by your health care provider. Make sure you discuss any questions you have with your health care provider.   Document Released: 05/10/2011 Document Revised: 11/15/2014 Document Reviewed: 09/26/2013 Elsevier Interactive Patient Education [employer].

## 2016-03-17 NOTE — Assessment & Plan Note (Signed)
No sx exam shows no murmur but prominent clock  follow

## 2016-05-18 DIAGNOSIS — H40053 Ocular hypertension, bilateral: Secondary | ICD-10-CM | POA: Diagnosis not present

## 2016-06-22 ENCOUNTER — Ambulatory Visit (INDEPENDENT_AMBULATORY_CARE_PROVIDER_SITE_OTHER): Payer: BLUE CROSS/BLUE SHIELD | Admitting: Family Medicine

## 2016-06-22 DIAGNOSIS — Z23 Encounter for immunization: Secondary | ICD-10-CM

## 2016-06-23 ENCOUNTER — Other Ambulatory Visit: Payer: Self-pay | Admitting: Obstetrics and Gynecology

## 2016-06-23 DIAGNOSIS — R921 Mammographic calcification found on diagnostic imaging of breast: Secondary | ICD-10-CM

## 2016-06-30 ENCOUNTER — Ambulatory Visit
Admission: RE | Admit: 2016-06-30 | Discharge: 2016-06-30 | Disposition: A | Discharge status: Home / Self Care | Payer: BLUE CROSS/BLUE SHIELD | Source: Ambulatory Visit | Attending: Obstetrics and Gynecology | Admitting: Obstetrics and Gynecology

## 2016-06-30 DIAGNOSIS — R921 Mammographic calcification found on diagnostic imaging of breast: Secondary | ICD-10-CM

## 2016-07-03 DIAGNOSIS — M79675 Pain in left toe(s): Secondary | ICD-10-CM | POA: Diagnosis not present

## 2016-07-28 ENCOUNTER — Telehealth: Payer: Self-pay | Admitting: Internal Medicine

## 2016-07-28 ENCOUNTER — Encounter: Payer: Self-pay | Admitting: Internal Medicine

## 2016-07-28 ENCOUNTER — Ambulatory Visit (INDEPENDENT_AMBULATORY_CARE_PROVIDER_SITE_OTHER): Payer: BLUE CROSS/BLUE SHIELD | Admitting: Internal Medicine

## 2016-07-28 VITALS — BP 126/80 | Temp 98.4°F | Wt 133.1 lb

## 2016-07-28 DIAGNOSIS — R079 Chest pain, unspecified: Secondary | ICD-10-CM

## 2016-07-28 DIAGNOSIS — I10 Essential (primary) hypertension: Secondary | ICD-10-CM | POA: Diagnosis not present

## 2016-07-28 NOTE — Patient Instructions (Signed)
Ok to start back on protonix. Incase  Reflux  But his acts more  Like a chest wall   Strain or such because of the way it occurred at onset and worse with bending over. Your ekg is good today .  Ok to take [employer]  but not  a reg basis   Tylenol is less Gi irritating   Avoid heavy lifting for now.  contact   Please call cardiology Dr.  Meda Coffee  office as you are over due for  OV ( last one jan 16 )  We will also flag them to reach out to you and get appt.

## 2016-07-28 NOTE — Telephone Encounter (Signed)
Patient Name: Amanda Patterson  DOB: 25-Dec-1955    Initial Comment Caller has a pain in her chest    Nurse Assessment  Nurse: Leilani Merl, RN, Heather Date/Time (Eastern Time): 07/28/2016 1:16:06 PM  Confirm and document reason for call. If symptomatic, describe symptoms. You must click the next button to save text entered. ---Caller states that she started with chest pain on Monday on the left side above her breast, it is constant, but gets worse with certain movements.  Has the patient traveled out of the country within the last 30 days? ---Not Applicable  Does the patient have any new or worsening symptoms? ---Yes  Will a triage be completed? ---Yes  Related visit to physician within the last 2 weeks? ---No  Does the PT have any chronic conditions? (i.e. diabetes, asthma, etc.) ---Yes  List chronic conditions. ---See MR  Is this a behavioral health or substance abuse call? ---No     Guidelines    Guideline Title Affirmed Question Affirmed Notes  Chest Pain [1] Chest pain lasts > 5 minutes AND [2] age > 53    Final Disposition User   Call EMS 911 Now Standifer, RN, [employer] the office, [phone] and 911 refusal reported.   Referrals  GO TO FACILITY REFUSED   Disagree/Comply: Disagree  Disagree/Comply Reason: Disagree with instructions

## 2016-07-28 NOTE — Telephone Encounter (Signed)
Called and spoke to pt. Pt is scheduled to see Dr. Regis Bill today at 3:00pm

## 2016-07-28 NOTE — Telephone Encounter (Signed)
Please Advise  Appointment scheduled with Dr. Regis Bill today @ 3:00pm

## 2016-07-28 NOTE — Progress Notes (Signed)
Pre visit review using our clinic review tool, if applicable. No additional management support is needed unless otherwise documented below in the visit note.  Chief Complaint  Patient presents with  . Chest Pain    HPI: Amanda Patterson 60 y.o. comes in today sent in by team health for an acute visit. This was for chest pain that she has had for a few days. She was instructed to go to the emergency room by the triage service but she declined and comes to the office. Onset  Monday  2 days  Continuous and worse  when bending  When feeling  Nowakening. ocass right fingers tinglin g.  Some right neck pain .  Hx of chest pain called to be seen for chest pain  NOBreathing issues . Describes this as Quick ache#.  evaluatated dr Meda Coffee 2016 for chest pain   Echo was normal  Re[ported  Diastolic dysfunction and min mr  No as calcifioed av Before this began she was picking up heavy suit[place] [employer] [place] with traveling. Assessment & Plan  1. Atypical chest pain - resolved after starting Protonix. She is completely normal resting EKG and normal stress test in 2012, she is very active and asymptomatic during exercise. There is no reason to perform stress test at this point.  2. Mitral valve prolapse - mild prolapse of the posterior leaflet of the mitral valve based on the report from 2012 associated with only trivial mitral regurgitation. However it appears on physical exam that her murmur has worsened. We will order an echocardiogram to evaluate.  3. Hypertension - its critical daily control blood pressure well especially since she has mitral valve prolapse associated with mitral regurgitation, we will therefore increase amlodipine to 5 mg daily.  4. Hyperlipidemia with LDL of 123: Is less than 100. She is advised to start taking red yeast rice.   Follow up in 1 year  Amanda Spark, MD, [facility] 11/22/2014, 10:41 AM ROS: See pertinent positives and negatives per HPI. No  cough fever hemoptysis vomiting shortness of breath sweating or associated symptoms. She does have some fullness or tightness in the right neck and tingling in her right hand fingers at times.  Past Medical History:  Diagnosis Date  . BP (high blood pressure)   . Chicken pox   . Headache(784.0)   . High cholesterol   . Hx of varicella   . Murmur, heart    MVP by hx  stress echo 2012 midl post prolapse valve    Family History  Problem Relation Age of Onset  . Diabetes Mother   . Hypertension Mother   . Stroke Father   . Heart disease Paternal Grandmother   . Stroke Brother   . Heart attack Neg Hx   . Hypertension Father     Social History   Social History  . Marital status: Married    Spouse name: N/A  . Number of children: N/A  . Years of education: N/A   Social History Main Topics  . Smoking status: Never Smoker  . Smokeless tobacco: Never Used  . Alcohol use No  . Drug use: Unknown  . Sexual activity: Not Asked   Other Topics Concern  . 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    Walking 5 day per week.    [place] years still has residence in [place] Medications Prior to Visit  Medication Sig Dispense Refill  . amLODipine (NORVASC) 5 MG tablet Take 1 tablet (5 mg total) by mouth daily. 90 tablet 11  . Cholecalciferol (VITAMIN D3) 2000 UNITS TABS Take 1 tablet by mouth daily.    . nadolol (CORGARD) 20 MG tablet TAKE 1/2 TABLET DAILY 90 tablet 0  . pantoprazole (PROTONIX) 40 MG tablet TAKE 1 TABLET BY MOUTH EVERY DAY 30 tablet 1  . pantoprazole (PROTONIX) 40 MG tablet Take 1 tablet (40 mg total) by mouth daily. 90 tablet 0  . typhoid (VIVOTIF) DR capsule Take 1 capsule by mouth  every other day. (Patient not taking: Reported on 03/17/2016) 4 capsule 0   No facility-administered medications prior to visit.      EXAM:  BP 126/80 (BP Location: Right Arm, Patient Position: Sitting, Cuff Size: Normal)   Temp 98.4 F (36.9 C) (Oral)   Wt 133 lb 1.6 oz (60.4 kg)   BMI 24.54 kg/m   Body mass index is 24.54 kg/m.  GENERAL: vitals reviewed and listed above, alert, oriented, appears well hydrated and in no acute distress HEENT: atraumatic, conjunctiva  clear, no obvious abnormalities on inspection of external nose and ears OP : no lesion edema or exudate  NECK: no obvious masses on inspection palpation Mild right trapezius stiffness. LUNGS: clear to auscultation bilaterally, no wheezes, rales or rhonchi, good air movement CV: HRRR, no clubbing cyanosis or  peripheral edema nl cap refill midsystolic click easily heard more in the upright position. Area of discomfort is the left upper chest about T4 but no point tenderness. I hear no rubs and her EKG is within normal limits sinus rhythm MS: moves all extremities without noticeable focal  abnormality PSYCH: pleasant and cooperative, no obvious depression or anxiety Wt Readings from Last 3 Encounters:  07/28/16 133 lb 1.6 oz (60.4 kg)  03/17/16 137 lb 3.2 oz (62.2 kg)  08/11/15 135 lb (61.2 kg)   BP Readings from Last 3 Encounters:  07/28/16 126/80  03/17/16 120/70  08/11/15 132/80   Lab Results  Component Value Date   WBC 6.5 03/03/2016   HGB 13.7 03/03/2016   HCT 40.8 03/03/2016   PLT 279.0 03/03/2016   GLUCOSE 115 (H) 03/03/2016   CHOL 198 03/03/2016   TRIG 93.0 03/03/2016   HDL 45.70 03/03/2016   LDLDIRECT 145.4 08/21/2012   LDLCALC 134 (H) 03/03/2016   ALT 20 03/03/2016   AST 17 03/03/2016   NA 142 03/03/2016   K 4.3 03/03/2016   CL 106 03/03/2016   CREATININE 0.87 03/03/2016   BUN 14 03/03/2016   CO2 30 03/03/2016   TSH 1.47 03/03/2016   HGBA1C 5.9 03/17/2016   ekg nsr ASSESSMENT AND  PLAN:  Discussed the following assessment and plan:  Chest pain, unspecified chest pain type - Plan: EKG 12-Lead  Essential hypertension - Plan: EKG 12-Lead This acts like chest wall pain or arm mechanical pain but she does have risk. She was supposed to see cardiology in January but was never contacted. At this point keep her on her proton X can use Tylenol  she should contact cardiology office and we will send a message also about getting her an appointment and follow-up. If she gets alarm symptoms will seek emergent care. -Patient advised to return or notify health care team  if symptoms worsen ,persist or new concerns arise. Total visit 54mins > 50% spent counseling and coordinating care as indicated in above note and in instructions to patient .     Patient Instructions  Ok to start back on protonix. Incase  Reflux  But his acts more  Like a chest wall   Strain or such because of the way it occurred at onset and worse with bending over. Your ekg is good today .  Ok to take [employer]  but not  a reg basis   Tylenol is less Gi irritating   Avoid heavy lifting for now.  contact   Please call cardiology Dr.  Meda Coffee  office as you are over due for  OV ( last one jan 16 )  We will also flag them to reach out to you and get appt.      Standley Brooking. Panosh M.D.

## 2016-07-28 NOTE — Telephone Encounter (Signed)
Patient is complaining of chest pains.  Patient refused 911 and wants someone from our office to call her.

## 2016-08-05 ENCOUNTER — Other Ambulatory Visit: Payer: Self-pay | Admitting: Internal Medicine

## 2016-08-05 NOTE — Telephone Encounter (Signed)
Pt request refill  nadolol (CORGARD) 20 MG tablet  3 mo supply  [facility]/ [place]

## 2016-08-06 MED ORDER — NADOLOL 20 MG PO TABS: 10.0000 mg | ORAL_TABLET | Freq: Every day | ORAL | 0 refills | Status: DC

## 2016-08-06 NOTE — Telephone Encounter (Signed)
Rx sent to pharmacy as requested.

## 2016-08-29 ENCOUNTER — Other Ambulatory Visit: Payer: Self-pay | Admitting: Internal Medicine

## 2016-08-29 DIAGNOSIS — I340 Nonrheumatic mitral (valve) insufficiency: Secondary | ICD-10-CM

## 2016-08-29 DIAGNOSIS — R0789 Other chest pain: Secondary | ICD-10-CM

## 2016-08-29 DIAGNOSIS — I341 Nonrheumatic mitral (valve) prolapse: Secondary | ICD-10-CM

## 2016-09-01 NOTE — Telephone Encounter (Signed)
Sent to the pharmacy for 6 months.  Pt had last cpx on 03/17/16 and has a 6 month follow up scheduled for 09/17/16.   3. Hypertension - its critical daily control blood pressure well especially since she has mitral valve prolapse associated with mitral regurgitation, we will therefore increase amlodipine to 5 mg daily.

## 2016-09-16 ENCOUNTER — Ambulatory Visit: Payer: BLUE CROSS/BLUE SHIELD | Admitting: Cardiology

## 2016-09-17 ENCOUNTER — Ambulatory Visit: Payer: BLUE CROSS/BLUE SHIELD | Admitting: Internal Medicine

## 2016-09-20 DIAGNOSIS — Z01419 Encounter for gynecological examination (general) (routine) without abnormal findings: Secondary | ICD-10-CM | POA: Diagnosis not present

## 2016-09-21 ENCOUNTER — Ambulatory Visit (INDEPENDENT_AMBULATORY_CARE_PROVIDER_SITE_OTHER): Payer: BLUE CROSS/BLUE SHIELD | Admitting: Internal Medicine

## 2016-09-21 ENCOUNTER — Encounter: Payer: Self-pay | Admitting: Internal Medicine

## 2016-09-21 ENCOUNTER — Ambulatory Visit: Payer: BLUE CROSS/BLUE SHIELD | Admitting: Family Medicine

## 2016-09-21 VITALS — BP 126/74 | Temp 98.1°F | Wt 134.8 lb

## 2016-09-21 DIAGNOSIS — Z23 Encounter for immunization: Secondary | ICD-10-CM | POA: Diagnosis not present

## 2016-09-21 DIAGNOSIS — I10 Essential (primary) hypertension: Secondary | ICD-10-CM

## 2016-09-21 DIAGNOSIS — Z6824 Body mass index (BMI) 24.0-24.9, adult: Secondary | ICD-10-CM | POA: Diagnosis not present

## 2016-09-21 DIAGNOSIS — Z13820 Encounter for screening for osteoporosis: Secondary | ICD-10-CM | POA: Diagnosis not present

## 2016-09-21 DIAGNOSIS — R739 Hyperglycemia, unspecified: Secondary | ICD-10-CM

## 2016-09-21 DIAGNOSIS — R012 Other cardiac sounds: Secondary | ICD-10-CM

## 2016-09-21 DIAGNOSIS — R011 Cardiac murmur, unspecified: Secondary | ICD-10-CM

## 2016-09-21 DIAGNOSIS — Z79899 Other long term (current) drug therapy: Secondary | ICD-10-CM | POA: Diagnosis not present

## 2016-09-21 DIAGNOSIS — Z13228 Encounter for screening for other metabolic disorders: Secondary | ICD-10-CM | POA: Diagnosis not present

## 2016-09-21 DIAGNOSIS — Z13220 Encounter for screening for lipoid disorders: Secondary | ICD-10-CM | POA: Diagnosis not present

## 2016-09-21 DIAGNOSIS — Z01419 Encounter for gynecological examination (general) (routine) without abnormal findings: Secondary | ICD-10-CM | POA: Diagnosis not present

## 2016-09-21 DIAGNOSIS — Z131 Encounter for screening for diabetes mellitus: Secondary | ICD-10-CM | POA: Diagnosis not present

## 2016-09-21 DIAGNOSIS — Z1321 Encounter for screening for nutritional disorder: Secondary | ICD-10-CM | POA: Diagnosis not present

## 2016-09-21 NOTE — Patient Instructions (Addendum)
Keep appt  With   Dr Meda Coffee.      Continue healthy eating and exercise . Since your GYNE did blood work none today .  Check up cpx with  Lab and  Hg a1c in 6 months or as needed

## 2016-09-21 NOTE — Progress Notes (Signed)
Chief Complaint  Patient presents with  . Follow-up    HPI: Amanda Patterson 60 y.o.   Comes in for 6 month fu   No more chest   Pain walking ok . At least  30 minutes  Just had labs at [facility] .   No new sx  Due for flu vaccine  Thinks her blood pressure is pretty good. Has a follow-up cardiology in January. ROS: See pertinent positives and negatives per HPI. No edema syncope neurologic symptoms. Due for flu shot.  Past Medical History:  Diagnosis Date  . BP (high blood pressure)   . Chicken pox   . Headache(784.0)   . High cholesterol   . Hx of varicella   . Murmur, heart    MVP by hx  stress echo 2012 midl post prolapse valve    Family History  Problem Relation Age of Onset  . Diabetes Mother   . Hypertension Mother   . Stroke Father   . Heart disease Paternal Grandmother   . Stroke Brother   . Heart attack Neg Hx   . Hypertension Father     Social History   Social History  . Marital status: Married    Spouse name: N/A  . Number of children: N/A  . Years of education: N/A   Social History Main Topics  . Smoking status: Never Smoker  . Smokeless tobacco: Never Used  . Alcohol use No  . Drug use: Unknown  . Sexual activity: Not Asked   Other Topics Concern  . 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    Walking 5 day per week.    Now  [facility] for a few years still has residence in [place] Medications Prior to Visit  Medication Sig Dispense Refill  . amLODipine (NORVASC) 5 MG tablet TAKE 1 TABLET (5 MG TOTAL) BY MOUTH DAILY. 90 tablet 1  . Cholecalciferol (VITAMIN D3) 2000 UNITS TABS Take 1 tablet by mouth daily.    . nadolol (CORGARD) 20 MG tablet Take 0.5 tablets (10 mg total) by mouth  daily. 90 tablet 0  . pantoprazole (PROTONIX) 40 MG tablet TAKE 1 TABLET BY MOUTH EVERY DAY 30 tablet 1   No facility-administered medications prior to visit.      EXAM:  BP 126/74 (BP Location: Right Arm, Patient Position: Sitting, Cuff Size: Normal)   Temp 98.1 F (36.7 C) (Oral)   Wt 134 lb 12.8 oz (61.1 kg)   BMI 24.86 kg/m   Body mass index is 24.86 kg/m.  GENERAL: vitals reviewed and listed above, alert, oriented, appears well hydrated and in no acute distress HEENT: atraumatic, conjunctiva  clear, no obvious abnormalities on inspection of external nose and ears  NECK: no obvious masses on inspection palpation  LUNGS: clear to auscultation bilaterally, no wheezes, rales or rhonchi, good air movement CV: HRRR, no clubbing cyanosis or  peripheral edema nl cap refill   s1 s2  Mod loud click  And small short sem when upright  And  none  When laying down .  MS: moves all extremities without noticeable focal  abnormality PSYCH: pleasant and cooperative, no obvious depression or anxiety Lab Results  Component Value Date   WBC 6.5 03/03/2016   HGB 13.7 03/03/2016   HCT 40.8 03/03/2016   PLT 279.0 03/03/2016   GLUCOSE 115 (H) 03/03/2016   CHOL 198 03/03/2016   TRIG 93.0 03/03/2016   HDL 45.70 03/03/2016   LDLDIRECT 145.4 08/21/2012   LDLCALC 134 (H) 03/03/2016   ALT 20 03/03/2016   AST 17 03/03/2016   NA 142 03/03/2016   K 4.3 03/03/2016   CL 106 03/03/2016   CREATININE 0.87 03/03/2016   BUN 14 03/03/2016   CO2 30 03/03/2016   TSH 1.47 03/03/2016   HGBA1C 5.9 03/17/2016   BP Readings from Last 3 Encounters:  09/21/16 126/74  07/28/16 126/80  03/17/16 120/70   Wt Readings from Last 3 Encounters:  09/21/16 134 lb 12.8 oz (61.1 kg)  07/28/16 133 lb 1.6 oz (60.4 kg)  03/17/16 137 lb 3.2 oz (62.2 kg)    ASSESSMENT AND PLAN:  Discussed the following assessment and plan:  Essential hypertension  Hyperglycemia  Medication management  Systolic click  Need  for prophylactic vaccination and inoculation against influenza - Plan: Flu Vaccine QUAD 36+ mos PF IM (Fluarix & Fluzone Quad PF) Delayed getting follow-up blood sugar because apparently done at her GYN and we can wait till next checkup. She will continue lifestyle interventions as planned and follow-up with Dr. Meda Coffee for her  cardiovascular exam. -Patient advised to return or notify health care team  if symptoms worsen ,persist or new concerns arise.  Patient Instructions  Keep appt  With   Dr Meda Coffee.      Continue healthy eating and exercise . Since your [facility] did blood work none today .  Check up cpx with  Lab and  Hg a1c in 6 months or as needed     Mariann Laster K. Alida Greiner M.D.

## 2016-09-21 NOTE — Progress Notes (Signed)
Pre visit review using our clinic review tool, if applicable. No additional management support is needed unless otherwise documented below in the visit note. 

## 2016-09-24 ENCOUNTER — Ambulatory Visit (INDEPENDENT_AMBULATORY_CARE_PROVIDER_SITE_OTHER): Payer: BLUE CROSS/BLUE SHIELD | Admitting: Internal Medicine

## 2016-09-24 ENCOUNTER — Encounter: Payer: Self-pay | Admitting: Internal Medicine

## 2016-09-24 ENCOUNTER — Telehealth: Payer: Self-pay | Admitting: Internal Medicine

## 2016-09-24 VITALS — BP 122/76 | Temp 96.8°F | Wt 135.0 lb

## 2016-09-24 DIAGNOSIS — B9789 Other viral agents as the cause of diseases classified elsewhere: Secondary | ICD-10-CM

## 2016-09-24 DIAGNOSIS — J028 Acute pharyngitis due to other specified organisms: Secondary | ICD-10-CM | POA: Diagnosis not present

## 2016-09-24 DIAGNOSIS — J029 Acute pharyngitis, unspecified: Secondary | ICD-10-CM | POA: Diagnosis not present

## 2016-09-24 LAB — POCT RAPID STREP A (OFFICE): Rapid Strep A Screen: NEGATIVE

## 2016-09-24 NOTE — Progress Notes (Signed)
Pre visit review using our clinic review tool, if applicable. No additional management support is needed unless otherwise documented below in the visit note.  Chief Complaint  Patient presents with  . Sore Throat    Started yesterday  . Cough    HPI: Amanda Patterson 60 y.o. comes in [place] appointment soon after last visit she started getting a sore throat and then a cough with clear mucus. Hurts to swallow. 41-month-old grandchild a week or so ago had a cold. She is to visit with them this weekend. No fever shortness of breath vomiting. No known strep exposure. ROS: See pertinent positives and negatives per HPI.  Past Medical History:  Diagnosis Date  . BP (high blood pressure)   . Chicken pox   . Headache(784.0)   . High cholesterol   . Hx of varicella   . Murmur, heart    MVP by hx  stress echo 2012 midl post prolapse valve    Family History  Problem Relation Age of Onset  . Diabetes Mother   . Hypertension Mother   . Stroke Father   . Heart disease Paternal Grandmother   . Stroke Brother   . Heart attack Neg Hx   . Hypertension Father     Social History   Social History  . Marital status: Married    Spouse name: N/A  . Number of children: N/A  . Years of education: N/A   Social History Main Topics  . Smoking status: Never Smoker  . Smokeless tobacco: Never Used  . Alcohol use No  . Drug use: Unknown  . Sexual activity: Not on file   Other Topics Concern  . 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    Walking 5 day per week.    Now  [facility] for a few years still has residence in [place] Medications Prior to Visit  Medication Sig Dispense Refill  . amLODipine (NORVASC) 5 MG tablet TAKE 1  TABLET (5 MG TOTAL) BY MOUTH DAILY. 90 tablet 1  . Ascorbic Acid (VITAMIN C) 1000 MG tablet Take 1,000 mg by mouth daily.    . Cholecalciferol (VITAMIN D3) 2000 UNITS TABS Take 1 tablet by mouth daily.    . nadolol (CORGARD) 20 MG tablet Take 0.5 tablets (10 mg total) by mouth daily. 90 tablet 0  . pantoprazole (PROTONIX) 40 MG tablet TAKE 1 TABLET BY MOUTH EVERY DAY 30 tablet 1  . Psyllium (METAMUCIL PO) Take by mouth.     No facility-administered medications prior to visit.      EXAM:  BP 122/76 (BP Location: Right Arm, Patient Position: Sitting, Cuff Size: Normal)   Temp (!) 96.8 F (36 C) (Oral)   Wt 135 lb (61.2 kg)   BMI 24.89 kg/m   Body mass index is 24.89 kg/m. Well-developed well-nourished in no acute distress minor hoarseness no stridor nontoxic. HEENT normocephalic atraumatic eyes clear TMs intact with normal landmarks nares minimal disc 0 congestion face nontender. OP moderate erythema but no  edema or exudate. Neck supple without adenopathy or masses. Chest clear to auscultation breath sounds equal cardiovascular regular rhythm Skin normal capillary refill noted acute rashes. rs neg    ASSESSMENT AND PLAN:  Discussed the following assessment and plan:  Sore throat - Plan: POC Rapid Strep A, Culture, Group A Strep, CANCELED: Throat culture  Acute viral pharyngitis Most likely a viral respiratory infection expectant management discussed symptomatic treatment follow-up for alarm symptoms. Masks given good handwashing discussed communicability. -Patient advised to return or notify health care team  if symptoms worsen ,persist or new concerns arise.  Patient Instructions  This is most likely a viral respiratory infection that is affecting your throat and vocal cords and eventually the tracheal bronchial tubes. He will need to run its course of antibiotics won't help get better. Quicker .  Warm gargles over-the-counter cough medicine okay such as Delsym. Can help you  feel better until your body gets rid of this infection. The illness can last 10 days but she should feel a lot better after the first 5-7 days. If you get high fever serious pain shortness of breath contact health care team. Your rapid strep test is negative will let you know about the throat culture when it comes back.    Standley Brooking. Julie Paolini M.D.

## 2016-09-24 NOTE — Telephone Encounter (Signed)
This patient called and left a message with just her name and date of birth. I am not just if she needs an appointment but she did sound like she had a sore throat.

## 2016-09-24 NOTE — Patient Instructions (Signed)
This is most likely a viral respiratory infection that is affecting your throat and vocal cords and eventually the tracheal bronchial tubes. He will need to run its course of antibiotics won't help get better. Quicker .  Warm gargles over-the-counter cough medicine okay such as Delsym. Can help you feel better until your body gets rid of this infection. The illness can last 10 days but she should feel a lot better after the first 5-7 days. If you get high fever serious pain shortness of breath contact health care team. Your rapid strep test is negative will let you know about the throat culture when it comes back.

## 2016-09-24 NOTE — Telephone Encounter (Signed)
Spoke to the pt. She has a sore throat.  Started 09/23/16.  Hard to swallow.  appt scheduled 09/24/16 @ 9:45.

## 2016-09-26 LAB — CULTURE, GROUP A STREP: ORGANISM ID, BACTERIA: NORMAL

## 2016-11-17 DIAGNOSIS — H40053 Ocular hypertension, bilateral: Secondary | ICD-10-CM | POA: Diagnosis not present

## 2016-11-18 ENCOUNTER — Ambulatory Visit (INDEPENDENT_AMBULATORY_CARE_PROVIDER_SITE_OTHER): Payer: BLUE CROSS/BLUE SHIELD | Admitting: Cardiology

## 2016-11-18 ENCOUNTER — Encounter: Payer: Self-pay | Admitting: Cardiology

## 2016-11-18 VITALS — BP 132/82 | HR 80 | Ht 62.0 in | Wt 134.6 lb

## 2016-11-18 DIAGNOSIS — I10 Essential (primary) hypertension: Secondary | ICD-10-CM | POA: Diagnosis not present

## 2016-11-18 DIAGNOSIS — E784 Other hyperlipidemia: Secondary | ICD-10-CM | POA: Diagnosis not present

## 2016-11-18 DIAGNOSIS — I341 Nonrheumatic mitral (valve) prolapse: Secondary | ICD-10-CM

## 2016-11-18 DIAGNOSIS — I208 Other forms of angina pectoris: Secondary | ICD-10-CM

## 2016-11-18 DIAGNOSIS — E7849 Other hyperlipidemia: Secondary | ICD-10-CM

## 2016-11-18 MED ORDER — RED YEAST RICE 600 MG PO CAPS: 600.0000 mg | ORAL_CAPSULE | Freq: Every day | ORAL | Status: DC

## 2016-11-18 NOTE — Patient Instructions (Signed)
Medication Instructions:   START TAKING OVER-THE-COUNTER RED YEAST RICE 600 MG TABLET--TAKE 1 TABLET BY MOUTH DAILY     Follow-Up:  Your physician wants you to follow-up in: [place] will receive a reminder letter in the mail two months in advance. If you don't receive a letter, please call our office to schedule the follow-up appointment.       If you need a refill on your cardiac medications before your next appointment, please call your pharmacy.

## 2016-11-18 NOTE — Progress Notes (Signed)
Patient ID: Amanda Patterson, female   DOB: June 04, 1956, 61 y.o.   MRN: [id number]     Patient Name: Amanda Patterson Date of Encounter: 11/18/2016  Primary Care Provider:  Lottie Dawson, MD Primary Cardiologist:  Ena Dawley   Problem List   Past Medical History:  Diagnosis Date  . BP (high blood pressure)   . Chicken pox   . Headache(784.0)   . High cholesterol   . Hx of varicella   . Murmur, heart    MVP by hx  stress echo 2012 midl post prolapse valve   Past Surgical History:  Procedure Laterality Date  . BIOPSY BREAST    . CESAREAN SECTION  [phone]    Allergies  No Known Allergies  HPI  A very pleasant 61 year old female who has prior medical history of hypertension and mitral valve prolapse and is coming with concern of chest pain. Her pain started about a month ago acute at rest was left-sided and sharp. Pain was not associated with any other symptoms such as shortness of breath or dizziness. Pain had no radiation. She saw her primary care physician that started her on proton next that completely resolved her chest pain. The patient had a stress echocardiogram performed in 2012 that showed excellent exercise capacity, no ischemia, mild prolapse of the posterior mitral valve leaflet and only trace mitral and tricuspid regurgitation. Her left atrial size was normal. She walks daily about 2 miles and feels no shortness of breath or chest pain. There is no family history of premature coronary artery disease.  11/18/2016 - the patient is coming after one year she states that she has felt great she continues to travel with her husband for [employer] work. She is no palpitations or syncope. She's noticed lower extremity edema only on long flights. Her chest pain has resolved since she was started on Protonix. She remains active and doesn't have exertional chest pain.  Home Medications  Prior to Admission medications   Medication Sig Start Date End Date Taking?  Authorizing Provider  amLODipine (NORVASC) 2.5 MG tablet TAKE 1 TABLET BY MOUTH EVERY DAY   Yes Burnis Medin, MD  Cholecalciferol (VITAMIN D3) 2000 UNITS TABS Take 1 tablet by mouth daily.   Yes Historical Provider, MD  nadolol (CORGARD) 20 MG tablet TAKE 1/2 TABLET BY MOUTH EVERY DAY   Yes Burnis Medin, MD  pantoprazole (PROTONIX) 40 MG tablet Take 1 tablet (40 mg total) by mouth daily. 10/16/14  Yes Marin Olp, MD    Family History  Family History  Problem Relation Age of Onset  . Diabetes Mother   . Hypertension Mother   . Stroke Father   . Hypertension Father   . Heart disease Paternal Grandmother   . Stroke Brother   . Heart attack Neg Hx     Social History  Social History   Social History  . Marital status: Married    Spouse name: N/A  . Number of children: N/A  . Years of education: N/A   Occupational History  . Not on file.   Social History Main Topics  . Smoking status: Never Smoker  . Smokeless tobacco: Never Used  . Alcohol use No  . Drug use: Unknown  . Sexual activity: Not on file   Other Topics Concern  . 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    Walking 5 day per week.    Now  [facility] for a few years still has residence in [place], as per HPI, otherwise negative General:  No chills, fever, night sweats or weight changes.  Cardiovascular:  No chest pain, dyspnea on exertion, edema, orthopnea, palpitations, paroxysmal nocturnal dyspnea. Dermatological: No rash, lesions/masses Respiratory: No cough, dyspnea Urologic: No hematuria, dysuria Abdominal:   No nausea, vomiting, diarrhea, bright red blood per rectum, melena, or hematemesis Neurologic:  No visual  changes, wkns, changes in mental status. All other systems reviewed and are otherwise negative except as noted above.  Physical Exam  Blood pressure 132/82, pulse 80, height 5\' 2"  (1.575 m), weight 134 lb 9.6 oz (61.1 kg).  General: Pleasant, NAD Psych: Normal affect. Neuro: Alert and oriented X 3. Moves all extremities spontaneously. HEENT: Normal  Neck: Supple without bruits or JVD. Lungs:  Resp regular and unlabored, CTA. Heart: RRR no s3, s4, midsystolic click followed by 3/6 systolic murmur.. Abdomen: Soft, non-tender, non-distended, BS + x 4.  Extremities: No clubbing, cyanosis or edema. DP/PT/Radials 2+ and equal bilaterally.  Labs:  No results for input(s): CKTOTAL, CKMB, TROPONINI in the last 72 hours. Lab Results  Component Value Date   WBC 6.5 03/03/2016   HGB 13.7 03/03/2016   HCT 40.8 03/03/2016   MCV 85.4 03/03/2016   PLT 279.0 03/03/2016    No results found for: DDIMER Invalid input(s): POCBNP    Component Value Date/Time   NA 142 03/03/2016 0853   K 4.3 03/03/2016 0853   CL 106 03/03/2016 0853   CO2 30 03/03/2016 0853   GLUCOSE 115 (H) 03/03/2016 0853   BUN 14 03/03/2016 0853   CREATININE 0.87 03/03/2016 0853   CALCIUM 9.4 03/03/2016 0853   PROT 7.0 03/03/2016 0853   ALBUMIN 4.2 03/03/2016 0853   AST 17 03/03/2016 0853   ALT 20 03/03/2016 0853   ALKPHOS 50 03/03/2016 0853   BILITOT 0.5 03/03/2016 0853   GFRNONAA 81 (L) 08/23/2013 1643   GFRAA >90 08/23/2013 1643   Lab Results  Component Value Date   CHOL 198 03/03/2016   HDL 45.70 03/03/2016   LDLCALC 134 (H) 03/03/2016   TRIG 93.0 03/03/2016    Accessory Clinical Findings  Echocardiogram - stress echo in 2012 see description in history of present illness.  TTE: 12/13/2014 - Left ventricle: The cavity size was normal. Wall thickness was normal. Systolic function was normal. The estimated ejection fraction was in the range of 55% to 60%. Wall motion was normal; there were no regional  wall motion abnormalities. Features are consistent with a pseudonormal left ventricular filling pattern, with concomitant abnormal relaxation and increased filling pressure (grade 2 diastolic dysfunction). - Aortic valve: There was no stenosis. - Mitral valve: There was mild regurgitation. - Left atrium: The atrium was mildly dilated. - Right ventricle: The cavity size was normal. Systolic function was normal. - Pulmonary arteries: No complete TR doppler jet so unable to estimate PA systolic pressure. - Inferior vena cava: The vessel was normal in size. The respirophasic diameter changes were in the normal range (>= 50%), consistent with normal central  venous pressure.  Impressions:  - Normal LV size and systolic function, EF 0000000. Moderate diastolic dysfunction. Normal RV size and systolic function. No significant valvular abnormalities.  ECG - normal sinus rhythm, 73 bpm, normal EKG.   Assessment & Plan  61 year old female  1. Atypical chest pain - resolved after starting Protonix. She is completely normal resting EKG and normal stress test in 2012, she is very active and asymptomatic during exercise.  Her EKG in September 2017 was completely normal.  2. Mitral valve prolapse - mild prolapse of the posterior leaflet of the mitral valve based on the report from 2012 associated with only trivial mitral regurgitation. Repeated echocardiogram in 2016 showed normal LVEF and only mild mitral regurgitation. There is no need to repeat echocardiograms unless there are new symptoms or worsening murmur on physical exam.  3. Hypertension - well controlled on current regimen.  4. Hyperlipidemia with LDL of 134: Is less than 100. She is advised to start taking red yeast rice as she is very reluctant to start taking statins.   Follow up in 1 year  Ena Dawley, MD, [facility] 11/18/2016, 9:41 AM

## 2017-02-09 ENCOUNTER — Other Ambulatory Visit: Payer: Self-pay | Admitting: Internal Medicine

## 2017-02-18 ENCOUNTER — Telehealth: Payer: Self-pay | Admitting: Internal Medicine

## 2017-02-18 NOTE — Telephone Encounter (Signed)
° ° °  Pt request refill of the following:   pantoprazole (PROTONIX) 40 MG tablet   Phamacy:  [facility]

## 2017-02-18 NOTE — Telephone Encounter (Signed)
Per chart pt has not been seen/treated for any acid reflux in the past year.   Dr. Regis Bill - Please advise. Thanks!

## 2017-02-20 NOTE — Telephone Encounter (Signed)
Please have her make appt  For fu in May  Can refill 30 # in the interim of protonix. Update how often she is taking medicatio as this has not been refilled for quite a while.

## 2017-02-21 NOTE — Telephone Encounter (Signed)
Spoke with pt and she states that she does not need refill any longer. She found a bottle that she has and it does not expire until 2019 so she will take that first and then let us know if she needs refill.

## 2017-02-21 NOTE — Telephone Encounter (Signed)
LMTCB

## 2017-02-28 ENCOUNTER — Telehealth: Payer: Self-pay | Admitting: Emergency Medicine

## 2017-02-28 ENCOUNTER — Other Ambulatory Visit: Payer: Self-pay | Admitting: Internal Medicine

## 2017-02-28 DIAGNOSIS — I341 Nonrheumatic mitral (valve) prolapse: Secondary | ICD-10-CM

## 2017-02-28 DIAGNOSIS — I340 Nonrheumatic mitral (valve) insufficiency: Secondary | ICD-10-CM

## 2017-02-28 DIAGNOSIS — R0789 Other chest pain: Secondary | ICD-10-CM

## 2017-02-28 NOTE — Telephone Encounter (Signed)
Sending a month supply of Amlodipine. Pt needs an appointment for CPX. Please schedule.

## 2017-03-08 ENCOUNTER — Telehealth: Payer: Self-pay | Admitting: Emergency Medicine

## 2017-03-08 MED ORDER — NADOLOL 20 MG PO TABS: 10.0000 mg | ORAL_TABLET | Freq: Every day | ORAL | 0 refills | Status: DC

## 2017-03-08 NOTE — Telephone Encounter (Signed)
Sent in a month supply for Nadolol. Pt needs to schedule an appointment for a CPX for any further refills. Please schedule.

## 2017-03-09 NOTE — Telephone Encounter (Signed)
lmom for pt to call back

## 2017-03-11 DIAGNOSIS — R7309 Other abnormal glucose: Secondary | ICD-10-CM | POA: Diagnosis not present

## 2017-03-14 NOTE — Telephone Encounter (Signed)
Pt has ben scheduled Friday, June 1 at 9:15.

## 2017-03-14 NOTE — Telephone Encounter (Signed)
lmom for pt to call back

## 2017-03-17 NOTE — Telephone Encounter (Signed)
Pt has been scheduled.  °

## 2017-03-22 LAB — BASIC METABOLIC PANEL
BUN: 7 mg/dL (ref 4–21)
Creatinine: 0.8 mg/dL (ref 0.5–1.1)
GLUCOSE: 105 mg/dL
Potassium: 4.5 mmol/L (ref 3.4–5.3)
Sodium: 142 mmol/L (ref 137–147)

## 2017-03-22 LAB — HEPATIC FUNCTION PANEL
ALT: 18 U/L (ref 7–35)
AST: 18 U/L (ref 13–35)
Alkaline Phosphatase: 55 U/L (ref 25–125)
BILIRUBIN, TOTAL: 0.4 mg/dL

## 2017-03-22 LAB — HEMOGLOBIN A1C: HEMOGLOBIN A1C: 5.8

## 2017-03-23 ENCOUNTER — Encounter: Payer: Self-pay | Admitting: Family Medicine

## 2017-04-06 ENCOUNTER — Other Ambulatory Visit: Payer: Self-pay | Admitting: Internal Medicine

## 2017-04-06 NOTE — Progress Notes (Signed)
Chief Complaint  Patient presents with  . Annual Exam    HPI: Patient  Amanda Patterson  61 y.o. comes in today for Preventive Health Care visit  And med management  HT taking med no se noted LIipids not taking  Red yeast rice concern about se and other issue  But trying plant based diet . t Had labs at [facility] dr Amanda Patterson  Ac was 5.8  Pap Sep 21 2016  Not as much exercise travel  Leg cramps mostly nocturnal wears heels at times using conplression stocking when long travel. Only using protonix as needed     Health Maintenance  Topic Date Due  . PAP SMEAR  06/09/2015  . Hepatitis C Screening  07/28/2017 (Originally 02/27/1956)  . HIV Screening  07/28/2017 (Originally 11/21/1970)  . INFLUENZA VACCINE  06/08/2017  . MAMMOGRAM  06/30/2018  . COLONOSCOPY  04/21/2020  . TETANUS/TDAP  02/28/2024   Health Maintenance Review LIFESTYLE:  Exercise:   Getting better  Tobacco/ETS: no Alcohol:  no Sugar beverages: no Sleep: tries  For 8 hours  Drug use: no HH of  Of 3   Non pets  Work: travel  Pensions consultant .     ROS:  Leg cramps  GEN/ HEENT: No fever, significant weight changes sweats headaches vision problems hearing changes, CV/ PULM; No chest pain shortness of breath cough, syncope,edema  change in exercise tolerance. GI /GU: No adominal pain, vomiting, change in bowel habits. No blood in the stool. No significant GU symptoms. SKIN/HEME: ,no acute skin rashes suspicious lesions or bleeding. No lymphadenopathy, nodules, masses.  NEURO/ PSYCH:  No neurologic signs such as weakness numbness. No depression anxiety. IMM/ Allergy: No unusual infections.  Allergy .   REST of 12 system review negative except as per HPI   Past Medical History:  Diagnosis Date  . BP (high blood pressure)   . Chicken pox   . Headache(784.0)   . High cholesterol   . Hx of varicella   . Murmur, heart    MVP by hx  stress echo 2012 midl post prolapse valve    Past Surgical History:  Procedure  Laterality Date  . BIOPSY BREAST    . CESAREAN SECTION  [id number]    Family History  Problem Relation Age of Onset  . Diabetes Mother   . Hypertension Mother   . Stroke Father   . Hypertension Father   . Heart disease Paternal Grandmother   . Stroke Brother   . Heart attack Neg Hx     Social History   Social History  . Marital status: Married    Spouse name: N/A  . Number of children: N/A  . Years of education: N/A   Social History Main Topics  . Smoking status: Never Smoker  . Smokeless tobacco: Never Used  . Alcohol use No  . Drug use: No  . Sexual activity: Not Asked   Other Topics Concern  . 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    Walking 5 day per week.    Now  [place]  a few years still has residence in [place]:  BP 118/80 (BP Location: Right Arm, Patient Position: Sitting, Cuff Size: Normal)   Pulse 68   Temp 98.4 F (36.9 C) (Oral)   Ht 5' 2.5" (1.588 m)   Wt 133 lb 11.2 oz (60.6 kg)   BMI 24.06 kg/m   Body mass index is 24.06 kg/m. Wt Readings from Last 3 Encounters:  04/08/17 133 lb 11.2 oz (60.6 kg)  11/18/16 134 lb 9.6 oz (61.1 kg)  09/24/16 135 lb (61.2 kg)    Physical Exam: Vital signs reviewed [name] is a well-developed well-nourished alert cooperative    who appearsr stated age in no acute distress.  HEENT: normocephalic atraumatic , Eyes: PERRL EOM's full, conjunctiva clear, Nares: paten,t no deformity discharge or tenderness., Ears: no deformity EAC's clear TMs with normal landmarks. Mouth: clear OP, no lesions, edema.  Moist mucous membranes. Dentition in adequate repair. NECK: supple without masses, thyromegaly or bruits. CHEST/PULM:  Clear to auscultation  and percussion breath sounds equal no wheeze , rales or rhonchi. No chest wall deformities or tenderness. Breast: normal by inspection . No dimpling, discharge, masses, tenderness or discharge . CV: PMI is nondisplaced, S1 S2 no gallops, murmurs, rubs loud mid sysolic click  Sitting and laying . Peripheral pulses are full without delay.No JVD .  ABDOMEN: Bowel sounds normal nontender  No guard or rebound, no hepato splenomegal no CVA tenderness.  No hernia. Extremtities:  No clubbing cyanosis or edema, no acute joint swelling or redness no focal atrophy NEURO:  Oriented x3, cranial nerves 3-12 appear to be intact, no obvious focal weakness,gait within normal limits no abnormal reflexes or asymmetrical SKIN: No acute rashes normal turgor, color, no bruising or petechiae. PSYCH: Oriented, good eye contact, no obvious depression anxiety, cognition and judgment appear normal. LN: no cervical axillary inguinal adenopathy  Lab Results  Component Value Date   WBC 6.5 03/03/2016   HGB 13.7 03/03/2016   HCT 40.8 03/03/2016   PLT 279.0 03/03/2016   GLUCOSE 115 (H) 03/03/2016   CHOL 198 03/03/2016   TRIG 93.0 03/03/2016   HDL 45.70 03/03/2016   LDLDIRECT 145.4 08/21/2012   LDLCALC 134 (H) 03/03/2016   ALT 18 03/22/2017   AST 18 03/22/2017   NA 142 03/22/2017   K 4.5 03/22/2017   CL 106 03/03/2016   CREATININE 0.8 03/22/2017   BUN 7 03/22/2017   CO2 30 03/03/2016   TSH 1.47 03/03/2016   HGBA1C 5.8 03/22/2017    BP Readings from Last 3 Encounters:  04/08/17 118/80  11/18/16 132/82  09/24/16 122/76    Lab results reviewed with patient   ASSESSMENT AND PLAN:  Discussed the following assessment and plan:  Visit for preventive health examination - Plan: Lipid panel, CBC with Differential/Platelet, Magnesium  Essential hypertension - Plan: Lipid panel, CBC with Differential/Platelet, Magnesium  Hyperglycemia - goo a1c  last check 5.8 - Plan: Lipid panel, CBC with  Differential/Platelet, Magnesium  Medication management - Plan: Lipid panel, CBC with Differential/Platelet, Magnesium  Hyperlipidemia, unspecified hyperlipidemia type - not taking supplements  - Plan: Lipid panel, CBC with Differential/Platelet, Magnesium  Leg cramps - Plan: Lipid panel, CBC with Differential/Platelet, Magnesium  MVP (mitral valve prolapse) Labs done elsewhere  But lipids etc not done.  Counseled.  Healthy life style  Avoiding dm  Etc  Patient Care Team: Burnis Medin, MD as  PCP - General (Internal Medicine) Arvella Nigh, MD (Obstetrics and Gynecology) Lewie Loron, [place] as Physician Assistant (Chiropractic Medicine) [name], MD as Consulting Physician (Cardiology) Patient Instructions  Getting   Lab  Today .    Cholesterol and magnesium  And blodo count tod ay .  Stretch and stay hydrated .   Plant based type eating  Can be heart healthy but make sure get enough  protiens and stay hydrated .     Health Maintenance, Female Adopting a healthy lifestyle and getting preventive care can go a long way to promote health and wellness. Talk with your health care provider about what schedule of regular examinations is right for you. This is a good chance for you to check in with your provider about disease prevention and staying healthy. In between checkups, there are plenty of things you can do on your own. Experts have done a lot of research about which lifestyle changes and preventive measures are most likely to keep you healthy. Ask your health care provider for more information. Weight and diet Eat a healthy diet  Be sure to include plenty of vegetables, fruits, low-fat dairy products, and lean protein.  Do not eat a lot of foods high in solid fats, added sugars, or salt.  Get regular exercise. This is one of the most important things you can do for your health. ? Most adults should exercise for at least 150 minutes each week. The exercise should  increase your heart rate and make you sweat (moderate-intensity exercise). ? Most adults should also do strengthening exercises at least twice a week. This is in addition to the moderate-intensity exercise.  Maintain a healthy weight  Body mass index (BMI) is a measurement that can be used to identify possible weight problems. It estimates body fat based on height and weight. Your health care provider can help determine your BMI and help you achieve or maintain a healthy weight.  For females 26 years of age and older: ? A BMI below 18.5 is considered underweight. ? A BMI of 18.5 to 24.9 is normal. ? A BMI of 25 to 29.9 is considered overweight. ? A BMI of 30 and above is considered obese.  Watch levels of cholesterol and blood lipids  You should start having your blood tested for lipids and cholesterol at 59 years of age, then have this test every 5 years.  You may need to have your cholesterol levels checked more often if: ? Your lipid or cholesterol levels are high. ? You are older than 59 years of age. ? You are at high risk for heart disease.  Cancer screening Lung Cancer  Lung cancer screening is recommended for adults 11-55 years old who are at high risk for lung cancer because of a history of smoking.  A yearly low-dose CT scan of the lungs is recommended for people who: ? Currently smoke. ? Have quit within the past 15 years. ? Have at least a 30-pack-year history of smoking. A pack year is smoking an average of one pack of cigarettes a day for 1 year.  Yearly screening should continue until it has been 15 years since you quit.  Yearly screening should stop if you develop a health problem that would prevent you from having lung cancer treatment.  Breast Cancer  Practice breast self-awareness. This means understanding how your breasts normally appear and feel.  It also means doing regular breast self-exams. Let your health care provider know about any changes, no  matter  how small.  If you are in your 20s or 30s, you should have a clinical breast exam (CBE) by a health care provider every 1-3 years as part of a regular health exam.  If you are 74 or older, have a CBE every year. Also consider having a breast X-ray (mammogram) every year.  If you have a family history of breast cancer, talk to your health care provider about genetic screening.  If you are at high risk for breast cancer, talk to your health care provider about having an MRI and a mammogram every year.  Breast cancer gene (BRCA) assessment is recommended for women who have family members with BRCA-related cancers. BRCA-related cancers include: ? Breast. ? Ovarian. ? Tubal. ? Peritoneal cancers.  Results of the assessment will determine the need for genetic counseling and BRCA1 and BRCA2 testing.  Cervical Cancer Your health care provider may recommend that you be screened regularly for cancer of the pelvic organs (ovaries, uterus, and vagina). This screening involves a pelvic examination, including checking for microscopic changes to the surface of your cervix (Pap test). You may be encouraged to have this screening done every 3 years, beginning at age 84.  For women ages 26-65, health care providers may recommend pelvic exams and Pap testing every 3 years, or they may recommend the Pap and pelvic exam, combined with testing for human papilloma virus (HPV), every 5 years. Some types of HPV increase your risk of cervical cancer. Testing for HPV may also be done on women of any age with unclear Pap test results.  Other health care providers may not recommend any screening for nonpregnant women who are considered low risk for pelvic cancer and who do not have symptoms. Ask your health care provider if a screening pelvic exam is right for you.  If you have had past treatment for cervical cancer or a condition that could lead to cancer, you need Pap tests and screening for cancer for at least 20  years after your treatment. If Pap tests have been discontinued, your risk factors (such as having a new sexual partner) need to be reassessed to determine if screening should resume. Some women have medical problems that increase the chance of getting cervical cancer. In these cases, your health care provider may recommend more frequent screening and Pap tests.  Colorectal Cancer  This type of cancer can be detected and often prevented.  Routine colorectal cancer screening usually begins at 59 years of age and continues through 59 years of age.  Your health care provider may recommend screening at an earlier age if you have risk factors for colon cancer.  Your health care provider may also recommend using home test kits to check for hidden blood in the stool.  A small camera at the end of a tube can be used to examine your colon directly (sigmoidoscopy or colonoscopy). This is done to check for the earliest forms of colorectal cancer.  Routine screening usually begins at age 60.  Direct examination of the colon should be repeated every 5-10 years through 59 years of age. However, you may need to be screened more often if early forms of precancerous polyps or small growths are found.  Skin Cancer  Check your skin from head to toe regularly.  Tell your health care provider about any new moles or changes in moles, especially if there is a change in a mole's shape or color.  Also tell your health care provider if you have  a mole that is larger than the size of a pencil eraser.  Always use sunscreen. Apply sunscreen liberally and repeatedly throughout the day.  Protect yourself by wearing long sleeves, pants, a wide-brimmed hat, and sunglasses whenever you are outside.  Heart disease, diabetes, and high blood pressure  High blood pressure causes heart disease and increases the risk of stroke. High blood pressure is more likely to develop in: ? People who have blood pressure in the high  end of the normal range (130-139/85-89 mm Hg). ? People who are overweight or obese. ? People who are African American.  If you are 76-22 years of age, have your blood pressure checked every 3-5 years. If you are 49 years of age or older, have your blood pressure checked every year. You should have your blood pressure measured twice-once when you are at a hospital or clinic, and once when you are not at a hospital or clinic. Record the average of the two measurements. To check your blood pressure when you are not at a hospital or clinic, you can use: ? An automated blood pressure machine at a pharmacy. ? A home blood pressure monitor.  If you are between 18 years and 20 years old, ask your health care provider if you should take aspirin to prevent strokes.  Have regular diabetes screenings. This involves taking a blood sample to check your fasting blood sugar level. ? If you are at a normal weight and have a low risk for diabetes, have this test once every three years after 59 years of age. ? If you are overweight and have a high risk for diabetes, consider being tested at a younger age or more often. Preventing infection Hepatitis B  If you have a higher risk for hepatitis B, you should be screened for this virus. You are considered at high risk for hepatitis B if: ? You were born in a country where hepatitis B is common. Ask your health care provider which countries are considered high risk. ? Your parents were born in a high-risk country, and you have not been immunized against hepatitis B (hepatitis B vaccine). ? You have HIV or AIDS. ? You use needles to inject street drugs. ? You live with someone who has hepatitis B. ? You have had sex with someone who has hepatitis B. ? You get hemodialysis treatment. ? You take certain medicines for conditions, including cancer, organ transplantation, and autoimmune conditions.  Hepatitis C  Blood testing is recommended for: ? Everyone born from  [birth year] through 1963. ? Anyone with known risk factors for hepatitis C.  Sexually transmitted infections (STIs)  You should be screened for sexually transmitted infections (STIs) including gonorrhea and chlamydia if: ? You are sexually active and are younger than 59 years of age. ? You are older than 59 years of age and your health care provider tells you that you are at risk for this type of infection. ? Your sexual activity has changed since you were last screened and you are at an increased risk for chlamydia or gonorrhea. Ask your health care provider if you are at risk.  If you do not have HIV, but are at risk, it may be recommended that you take a prescription medicine daily to prevent HIV infection. This is called pre-exposure prophylaxis (PrEP). You are considered at risk if: ? You are sexually active and do not regularly use condoms or know the HIV status of your partner(s). ? You take drugs by injection. ?  You are sexually active with a partner who has HIV.  Talk with your health care provider about whether you are at high risk of being infected with HIV. If you choose to begin PrEP, you should first be tested for HIV. You should then be tested every 3 months for as long as you are taking PrEP. Pregnancy  If you are premenopausal and you may become pregnant, ask your health care provider about preconception counseling.  If you may become pregnant, take 400 to 800 micrograms (mcg) of folic acid every day.  If you want to prevent pregnancy, talk to your health care provider about birth control (contraception). Osteoporosis and menopause  Osteoporosis is a disease in which the bones lose minerals and strength with aging. This can result in serious bone fractures. Your risk for osteoporosis can be identified using a bone density scan.  If you are 9 years of age or older, or if you are at risk for osteoporosis and fractures, ask your health care provider if you should be  screened.  Ask your health care provider whether you should take a calcium or vitamin D supplement to lower your risk for osteoporosis.  Menopause may have certain physical symptoms and risks.  Hormone replacement therapy may reduce some of these symptoms and risks. Talk to your health care provider about whether hormone replacement therapy is right for you. Follow these instructions at home:  Schedule regular health, dental, and eye exams.  Stay current with your immunizations.  Do not use any tobacco products including cigarettes, chewing tobacco, or electronic cigarettes.  If you are pregnant, do not drink alcohol.  If you are breastfeeding, limit how much and how often you drink alcohol.  Limit alcohol intake to no more than 1 drink per day for nonpregnant women. One drink equals 12 ounces of beer, 5 ounces of wine, or 1 ounces of hard liquor.  Do not use street drugs.  Do not share needles.  Ask your health care provider for help if you need support or information about quitting drugs.  Tell your health care provider if you often feel depressed.  Tell your health care provider if you have ever been abused or do not feel safe at home. This information is not intended to replace advice given to you by your health care provider. Make sure you discuss any questions you have with your health care provider. Document Released: 05/10/2011 Document Revised: 04/01/2016 Document Reviewed: 07/29/2015 Elsevier Interactive Patient Education  2018 [place].     Leg Cramps Leg cramps occur when a muscle or muscles tighten and you have no control over this tightening (involuntary muscle contraction). Muscle cramps can develop in any muscle, but the most common place is in the calf muscles of the leg. Those cramps can occur during exercise or when you are at rest. Leg cramps are painful, and they may last for a few seconds to a few minutes. Cramps may return several times before they  finally stop. Usually, leg cramps are not caused by a serious medical problem. In many cases, the cause is not known. Some common causes include:  Overexertion.  Overuse from repetitive motions, or doing the same thing over and over.  Remaining in a certain position for a long period of time.  Improper preparation, form, or technique while performing a sport or an activity.  Dehydration.  Injury.  Side effects of some medicines.  Abnormally low levels of the salts and ions in your blood (electrolytes), especially  potassium and calcium. These levels could be low if you are taking water pills (diuretics) or if you are pregnant.  Follow these instructions at home: Watch your condition for any changes. Taking the following actions may help to lessen any discomfort that you are feeling:  Stay well-hydrated. Drink enough fluid to keep your urine clear or pale yellow.  Try massaging, stretching, and relaxing the affected muscle. Do this for several minutes at a time.  For tight or tense muscles, use a warm towel, heating pad, or hot shower water directed to the affected area.  If you are sore or have pain after a cramp, applying ice to the affected area may relieve discomfort. ? Put ice in a plastic bag. ? Place a towel between your skin and the bag. ? Leave the ice on for 20 minutes, 2-3 times per day.  Avoid strenuous exercise for several days if you have been having frequent leg cramps.  Make sure that your diet includes the essential minerals for your muscles to work normally.  Take medicines only as directed by your health care provider.  Contact a health care provider if:  Your leg cramps get more severe or more frequent, or they do not improve over time.  Your foot becomes cold, numb, or blue. This information is not intended to replace advice given to you by your health care provider. Make sure you discuss any questions you have with your health care provider. Document  Released: 12/02/2004 Document Revised: 04/01/2016 Document Reviewed: 10/02/2014 Elsevier Interactive Patient Education  2018 [place]. Britteney Ayotte M.D.

## 2017-04-08 ENCOUNTER — Ambulatory Visit (INDEPENDENT_AMBULATORY_CARE_PROVIDER_SITE_OTHER): Payer: BLUE CROSS/BLUE SHIELD | Admitting: Internal Medicine

## 2017-04-08 ENCOUNTER — Encounter: Payer: Self-pay | Admitting: Internal Medicine

## 2017-04-08 VITALS — BP 118/80 | HR 68 | Temp 98.4°F | Ht 62.5 in | Wt 133.7 lb

## 2017-04-08 DIAGNOSIS — I10 Essential (primary) hypertension: Secondary | ICD-10-CM

## 2017-04-08 DIAGNOSIS — Z0001 Encounter for general adult medical examination with abnormal findings: Secondary | ICD-10-CM

## 2017-04-08 DIAGNOSIS — E785 Hyperlipidemia, unspecified: Secondary | ICD-10-CM | POA: Diagnosis not present

## 2017-04-08 DIAGNOSIS — R252 Cramp and spasm: Secondary | ICD-10-CM

## 2017-04-08 DIAGNOSIS — R739 Hyperglycemia, unspecified: Secondary | ICD-10-CM

## 2017-04-08 DIAGNOSIS — Z79899 Other long term (current) drug therapy: Secondary | ICD-10-CM

## 2017-04-08 DIAGNOSIS — I341 Nonrheumatic mitral (valve) prolapse: Secondary | ICD-10-CM | POA: Diagnosis not present

## 2017-04-08 DIAGNOSIS — Z0000 Encounter for general adult medical examination without abnormal findings: Secondary | ICD-10-CM

## 2017-04-08 LAB — CBC WITH DIFFERENTIAL/PLATELET
Basophils Absolute: 0.1 10*3/uL (ref 0.0–0.1)
Basophils Relative: 0.9 % (ref 0.0–3.0)
EOS PCT: 2 % (ref 0.0–5.0)
Eosinophils Absolute: 0.2 10*3/uL (ref 0.0–0.7)
HCT: 41.2 % (ref 36.0–46.0)
HEMOGLOBIN: 13.6 g/dL (ref 12.0–15.0)
LYMPHS ABS: 2.1 10*3/uL (ref 0.7–4.0)
Lymphocytes Relative: 26.7 % (ref 12.0–46.0)
MCHC: 33 g/dL (ref 30.0–36.0)
MCV: 87.2 fl (ref 78.0–100.0)
MONOS PCT: 5.9 % (ref 3.0–12.0)
Monocytes Absolute: 0.5 10*3/uL (ref 0.1–1.0)
Neutro Abs: 5 10*3/uL (ref 1.4–7.7)
Neutrophils Relative %: 64.5 % (ref 43.0–77.0)
Platelets: 295 10*3/uL (ref 150.0–400.0)
RBC: 4.73 Mil/uL (ref 3.87–5.11)
RDW: 14.4 % (ref 11.5–15.5)
WBC: 7.7 10*3/uL (ref 4.0–10.5)

## 2017-04-08 LAB — LIPID PANEL
CHOLESTEROL: 186 mg/dL (ref 0–200)
HDL: 50.4 mg/dL (ref >39.00)
LDL Cholesterol: 112 mg/dL — ABNORMAL HIGH (ref 0–99)
NONHDL: 135.72
Total CHOL/HDL Ratio: 4
Triglycerides: 117 mg/dL (ref 0.0–149.0)
VLDL: 23.4 mg/dL (ref 0.0–40.0)

## 2017-04-08 LAB — MAGNESIUM: Magnesium: 2.4 mg/dL (ref 1.5–2.5)

## 2017-04-08 NOTE — Patient Instructions (Addendum)
Getting   Lab  Today .    Cholesterol and magnesium  And blodo count tod ay .  Stretch and stay hydrated .   Plant based type eating  Can be heart healthy but make sure get enough  protiens and stay hydrated .     Health Maintenance, Female Adopting a healthy lifestyle and getting preventive care can go a long way to promote health and wellness. Talk with your health care provider about what schedule of regular examinations is right for you. This is a good chance for you to check in with your provider about disease prevention and staying healthy. In between checkups, there are plenty of things you can do on your own. Experts have done a lot of research about which lifestyle changes and preventive measures are most likely to keep you healthy. Ask your health care provider for more information. Weight and diet Eat a healthy diet  Be sure to include plenty of vegetables, fruits, low-fat dairy products, and lean protein.  Do not eat a lot of foods high in solid fats, added sugars, or salt.  Get regular exercise. This is one of the most important things you can do for your health. ? Most adults should exercise for at least 150 minutes each week. The exercise should increase your heart rate and make you sweat (moderate-intensity exercise). ? Most adults should also do strengthening exercises at least twice a week. This is in addition to the moderate-intensity exercise.  Maintain a healthy weight  Body mass index (BMI) is a measurement that can be used to identify possible weight problems. It estimates body fat based on height and weight. Your health care provider can help determine your BMI and help you achieve or maintain a healthy weight.  For females 59 years of age and older: ? A BMI below 18.5 is considered underweight. ? A BMI of 18.5 to 24.9 is normal. ? A BMI of 25 to 29.9 is considered overweight. ? A BMI of 30 and above is considered obese.  Watch levels of cholesterol and blood  lipids  You should start having your blood tested for lipids and cholesterol at 59 years of age, then have this test every 5 years.  You may need to have your cholesterol levels checked more often if: ? Your lipid or cholesterol levels are high. ? You are older than 59 years of age. ? You are at high risk for heart disease.  Cancer screening Lung Cancer  Lung cancer screening is recommended for adults 59-65 years old who are at high risk for lung cancer because of a history of smoking.  A yearly low-dose CT scan of the lungs is recommended for people who: ? Currently smoke. ? Have quit within the past 15 years. ? Have at least a 30-pack-year history of smoking. A pack year is smoking an average of one pack of cigarettes a day for 1 year.  Yearly screening should continue until it has been 15 years since you quit.  Yearly screening should stop if you develop a health problem that would prevent you from having lung cancer treatment.  Breast Cancer  Practice breast self-awareness. This means understanding how your breasts normally appear and feel.  It also means doing regular breast self-exams. Let your health care provider know about any changes, no matter how small.  If you are in your 59s or 30s, you should have a clinical breast exam (CBE) by a health care provider every 1-3 years as part  of a regular health exam.  If you are 59 or older, have a CBE every year. Also consider having a breast X-ray (mammogram) every year.  If you have a family history of breast cancer, talk to your health care provider about genetic screening.  If you are at high risk for breast cancer, talk to your health care provider about having an MRI and a mammogram every year.  Breast cancer gene (BRCA) assessment is recommended for women who have family members with BRCA-related cancers. BRCA-related cancers include: ? Breast. ? Ovarian. ? Tubal. ? Peritoneal cancers.  Results of the assessment will  determine the need for genetic counseling and BRCA1 and BRCA2 testing.  Cervical Cancer Your health care provider may recommend that you be screened regularly for cancer of the pelvic organs (ovaries, uterus, and vagina). This screening involves a pelvic examination, including checking for microscopic changes to the surface of your cervix (Pap test). You may be encouraged to have this screening done every 3 years, beginning at age 59.  For women ages 59-65, health care providers may recommend pelvic exams and Pap testing every 3 years, or they may recommend the Pap and pelvic exam, combined with testing for human papilloma virus (HPV), every 5 years. Some types of HPV increase your risk of cervical cancer. Testing for HPV may also be done on women of any age with unclear Pap test results.  Other health care providers may not recommend any screening for nonpregnant women who are considered low risk for pelvic cancer and who do not have symptoms. Ask your health care provider if a screening pelvic exam is right for you.  If you have had past treatment for cervical cancer or a condition that could lead to cancer, you need Pap tests and screening for cancer for at least 20 years after your treatment. If Pap tests have been discontinued, your risk factors (such as having a new sexual partner) need to be reassessed to determine if screening should resume. Some women have medical problems that increase the chance of getting cervical cancer. In these cases, your health care provider may recommend more frequent screening and Pap tests.  Colorectal Cancer  This type of cancer can be detected and often prevented.  Routine colorectal cancer screening usually begins at 59 years of age and continues through 59 years of age.  Your health care provider may recommend screening at an earlier age if you have risk factors for colon cancer.  Your health care provider may also recommend using home test kits to check  for hidden blood in the stool.  A small camera at the end of a tube can be used to examine your colon directly (sigmoidoscopy or colonoscopy). This is done to check for the earliest forms of colorectal cancer.  Routine screening usually begins at age 16.  Direct examination of the colon should be repeated every 5-10 years through 59 years of age. However, you may need to be screened more often if early forms of precancerous polyps or small growths are found.  Skin Cancer  Check your skin from head to toe regularly.  Tell your health care provider about any new moles or changes in moles, especially if there is a change in a mole's shape or color.  Also tell your health care provider if you have a mole that is larger than the size of a pencil eraser.  Always use sunscreen. Apply sunscreen liberally and repeatedly throughout the day.  Protect yourself by wearing long  sleeves, pants, a wide-brimmed hat, and sunglasses whenever you are outside.  Heart disease, diabetes, and high blood pressure  High blood pressure causes heart disease and increases the risk of stroke. High blood pressure is more likely to develop in: ? People who have blood pressure in the high end of the normal range (130-139/85-89 mm Hg). ? People who are overweight or obese. ? People who are African American.  If you are 6-62 years of age, have your blood pressure checked every 3-5 years. If you are 75 years of age or older, have your blood pressure checked every year. You should have your blood pressure measured twice-once when you are at a hospital or clinic, and once when you are not at a hospital or clinic. Record the average of the two measurements. To check your blood pressure when you are not at a hospital or clinic, you can use: ? An automated blood pressure machine at a pharmacy. ? A home blood pressure monitor.  If you are between 35 years and 37 years old, ask your health care provider if you should take  aspirin to prevent strokes.  Have regular diabetes screenings. This involves taking a blood sample to check your fasting blood sugar level. ? If you are at a normal weight and have a low risk for diabetes, have this test once every three years after 59 years of age. ? If you are overweight and have a high risk for diabetes, consider being tested at a younger age or more often. Preventing infection Hepatitis B  If you have a higher risk for hepatitis B, you should be screened for this virus. You are considered at high risk for hepatitis B if: ? You were born in a country where hepatitis B is common. Ask your health care provider which countries are considered high risk. ? Your parents were born in a high-risk country, and you have not been immunized against hepatitis B (hepatitis B vaccine). ? You have HIV or AIDS. ? You use needles to inject street drugs. ? You live with someone who has hepatitis B. ? You have had sex with someone who has hepatitis B. ? You get hemodialysis treatment. ? You take certain medicines for conditions, including cancer, organ transplantation, and autoimmune conditions.  Hepatitis C  Blood testing is recommended for: ? Everyone born from [birth year] through 1963. ? Anyone with known risk factors for hepatitis C.  Sexually transmitted infections (STIs)  You should be screened for sexually transmitted infections (STIs) including gonorrhea and chlamydia if: ? You are sexually active and are younger than 59 years of age. ? You are older than 59 years of age and your health care provider tells you that you are at risk for this type of infection. ? Your sexual activity has changed since you were last screened and you are at an increased risk for chlamydia or gonorrhea. Ask your health care provider if you are at risk.  If you do not have HIV, but are at risk, it may be recommended that you take a prescription medicine daily to prevent HIV infection. This is called  pre-exposure prophylaxis (PrEP). You are considered at risk if: ? You are sexually active and do not regularly use condoms or know the HIV status of your partner(s). ? You take drugs by injection. ? You are sexually active with a partner who has HIV.  Talk with your health care provider about whether you are at high risk of being infected with HIV. If  you choose to begin PrEP, you should first be tested for HIV. You should then be tested every 3 months for as long as you are taking PrEP. Pregnancy  If you are premenopausal and you may become pregnant, ask your health care provider about preconception counseling.  If you may become pregnant, take 400 to 800 micrograms (mcg) of folic acid every day.  If you want to prevent pregnancy, talk to your health care provider about birth control (contraception). Osteoporosis and menopause  Osteoporosis is a disease in which the bones lose minerals and strength with aging. This can result in serious bone fractures. Your risk for osteoporosis can be identified using a bone density scan.  If you are 12 years of age or older, or if you are at risk for osteoporosis and fractures, ask your health care provider if you should be screened.  Ask your health care provider whether you should take a calcium or vitamin D supplement to lower your risk for osteoporosis.  Menopause may have certain physical symptoms and risks.  Hormone replacement therapy may reduce some of these symptoms and risks. Talk to your health care provider about whether hormone replacement therapy is right for you. Follow these instructions at home:  Schedule regular health, dental, and eye exams.  Stay current with your immunizations.  Do not use any tobacco products including cigarettes, chewing tobacco, or electronic cigarettes.  If you are pregnant, do not drink alcohol.  If you are breastfeeding, limit how much and how often you drink alcohol.  Limit alcohol intake to no more  than 1 drink per day for nonpregnant women. One drink equals 12 ounces of beer, 5 ounces of wine, or 1 ounces of hard liquor.  Do not use street drugs.  Do not share needles.  Ask your health care provider for help if you need support or information about quitting drugs.  Tell your health care provider if you often feel depressed.  Tell your health care provider if you have ever been abused or do not feel safe at home. This information is not intended to replace advice given to you by your health care provider. Make sure you discuss any questions you have with your health care provider. Document Released: 05/10/2011 Document Revised: 04/01/2016 Document Reviewed: 07/29/2015 Elsevier Interactive Patient Education  2018 [place].     Leg Cramps Leg cramps occur when a muscle or muscles tighten and you have no control over this tightening (involuntary muscle contraction). Muscle cramps can develop in any muscle, but the most common place is in the calf muscles of the leg. Those cramps can occur during exercise or when you are at rest. Leg cramps are painful, and they may last for a few seconds to a few minutes. Cramps may return several times before they finally stop. Usually, leg cramps are not caused by a serious medical problem. In many cases, the cause is not known. Some common causes include:  Overexertion.  Overuse from repetitive motions, or doing the same thing over and over.  Remaining in a certain position for a long period of time.  Improper preparation, form, or technique while performing a sport or an activity.  Dehydration.  Injury.  Side effects of some medicines.  Abnormally low levels of the salts and ions in your blood (electrolytes), especially potassium and calcium. These levels could be low if you are taking water pills (diuretics) or if you are pregnant.  Follow these instructions at home: Watch your condition for any  changes. Taking the following  actions may help to lessen any discomfort that you are feeling:  Stay well-hydrated. Drink enough fluid to keep your urine clear or pale yellow.  Try massaging, stretching, and relaxing the affected muscle. Do this for several minutes at a time.  For tight or tense muscles, use a warm towel, heating pad, or hot shower water directed to the affected area.  If you are sore or have pain after a cramp, applying ice to the affected area may relieve discomfort. ? Put ice in a plastic bag. ? Place a towel between your skin and the bag. ? Leave the ice on for 20 minutes, 2-3 times per day.  Avoid strenuous exercise for several days if you have been having frequent leg cramps.  Make sure that your diet includes the essential minerals for your muscles to work normally.  Take medicines only as directed by your health care provider.  Contact a health care provider if:  Your leg cramps get more severe or more frequent, or they do not improve over time.  Your foot becomes cold, numb, or blue. This information is not intended to replace advice given to you by your health care provider. Make sure you discuss any questions you have with your health care provider. Document Released: 12/02/2004 Document Revised: 04/01/2016 Document Reviewed: 10/02/2014 Elsevier Interactive Patient Education  [employer].

## 2017-05-06 ENCOUNTER — Other Ambulatory Visit: Payer: Self-pay | Admitting: Internal Medicine

## 2017-05-20 ENCOUNTER — Other Ambulatory Visit: Payer: Self-pay | Admitting: Internal Medicine

## 2017-05-20 DIAGNOSIS — R0789 Other chest pain: Secondary | ICD-10-CM

## 2017-05-20 DIAGNOSIS — I340 Nonrheumatic mitral (valve) insufficiency: Secondary | ICD-10-CM

## 2017-05-20 DIAGNOSIS — I341 Nonrheumatic mitral (valve) prolapse: Secondary | ICD-10-CM

## 2017-06-01 ENCOUNTER — Other Ambulatory Visit: Payer: Self-pay | Admitting: Internal Medicine

## 2017-06-17 ENCOUNTER — Encounter: Payer: Self-pay | Admitting: Adult Health

## 2017-06-17 ENCOUNTER — Ambulatory Visit (INDEPENDENT_AMBULATORY_CARE_PROVIDER_SITE_OTHER): Payer: BLUE CROSS/BLUE SHIELD | Admitting: Adult Health

## 2017-06-17 ENCOUNTER — Telehealth: Payer: Self-pay | Admitting: Internal Medicine

## 2017-06-17 VITALS — BP 146/80 | Temp 98.4°F | Wt 134.0 lb

## 2017-06-17 DIAGNOSIS — R1013 Epigastric pain: Secondary | ICD-10-CM

## 2017-06-17 LAB — CBC WITH DIFFERENTIAL/PLATELET
BASOS ABS: 0.1 10*3/uL (ref 0.0–0.1)
Basophils Relative: 0.9 % (ref 0.0–3.0)
EOS ABS: 0.1 10*3/uL (ref 0.0–0.7)
EOS PCT: 0.9 % (ref 0.0–5.0)
HCT: 43.9 % (ref 36.0–46.0)
HEMOGLOBIN: 14.3 g/dL (ref 12.0–15.0)
Lymphocytes Relative: 21.9 % (ref 12.0–46.0)
Lymphs Abs: 1.3 10*3/uL (ref 0.7–4.0)
MCHC: 32.7 g/dL (ref 30.0–36.0)
MCV: 88.4 fl (ref 78.0–100.0)
MONO ABS: 0.3 10*3/uL (ref 0.1–1.0)
Monocytes Relative: 4.8 % (ref 3.0–12.0)
Neutro Abs: 4.3 10*3/uL (ref 1.4–7.7)
Neutrophils Relative %: 71.5 % (ref 43.0–77.0)
Platelets: 293 10*3/uL (ref 150.0–400.0)
RBC: 4.96 Mil/uL (ref 3.87–5.11)
RDW: 14.2 % (ref 11.5–15.5)
WBC: 6 10*3/uL (ref 4.0–10.5)

## 2017-06-17 LAB — COMPREHENSIVE METABOLIC PANEL
ALBUMIN: 4.6 g/dL (ref 3.5–5.2)
ALK PHOS: 63 U/L (ref 39–117)
ALT: 19 U/L (ref 0–35)
AST: 17 U/L (ref 0–37)
BILIRUBIN TOTAL: 0.5 mg/dL (ref 0.2–1.2)
BUN: 9 mg/dL (ref 6–23)
CO2: 31 mEq/L (ref 19–32)
CREATININE: 0.8 mg/dL (ref 0.40–1.20)
Calcium: 10 mg/dL (ref 8.4–10.5)
Chloride: 103 mEq/L (ref 96–112)
GFR: 94.23 mL/min (ref >60.00)
GLUCOSE: 111 mg/dL — AB (ref 70–99)
Potassium: 4.4 mEq/L (ref 3.5–5.1)
SODIUM: 139 meq/L (ref 135–145)
TOTAL PROTEIN: 7.3 g/dL (ref 6.0–8.3)

## 2017-06-17 LAB — AMYLASE: Amylase: 50 U/L (ref 27–131)

## 2017-06-17 LAB — LIPASE: LIPASE: 20 U/L (ref 11.0–59.0)

## 2017-06-17 MED ORDER — GI COCKTAIL ~~LOC~~: 30.0000 mL | Freq: Two times a day (BID) | ORAL | 0 refills | Status: AC

## 2017-06-17 NOTE — Telephone Encounter (Signed)
Pt took the cocktail and wanted to know if it is suppose to make her dizzy and drowsiness.  Pt would like to have a call back.

## 2017-06-17 NOTE — Telephone Encounter (Signed)
Spoke to [place].  Cocktail does have a barbiturate and could potentially make her sleepy.  Called and spoke to the pt and advised sleepiness/dizziness could potentially be a side effect.  Advised no driving and to stay home until passes.  She notified me that she had a headache.  Instructed her to try Tylenol or Advil and follow directions on bottle.  Call back if needed.  Pt agreed to plan.

## 2017-06-17 NOTE — Patient Instructions (Signed)
It was great meeting you today   Your EKG was normal.   I am going to check some labs to make sure this is not pancreatitis.   I believe this is more acid indigestion. Continue taking Protonix and I have prescribed GI cocktail that you can use to help settle the stomach   Follow up as needed

## 2017-06-17 NOTE — Progress Notes (Signed)
Subjective:    Patient ID: Amanda Patterson, female    DOB: 09-09-1956, 61 y.o.   MRN: [id number]  HPI  61 year old female who  has a past medical history of BP (high blood pressure); Chicken pox; Headache(784.0); High cholesterol; varicella; and Murmur, heart. She is a patient of Dr. Regis Bill who I am seeing today for an acute issue of epigastric pain  with radiating pain under her left breast. She has had this pain for less than 24 hours and is described as a "sharp pain".   She reports that " burping helps relieve the pain a little bit".   She denies any radiating pain up her left arm or jaw.   She does not smoke, drink, or use recreational drugs   She reports that she stopped taking her Protonix   Review of Systems See HPI   Past Medical History:  Diagnosis Date  . BP (high blood pressure)   . Chicken pox   . Headache(784.0)   . High cholesterol   . Hx of varicella   . Murmur, heart    MVP by hx  stress echo 2012 midl post prolapse valve    Social History   Social History  . Marital status: Married    Spouse name: N/A  . Number of children: N/A  . Years of education: N/A   Occupational History  . Not on file.   Social History Main Topics  . Smoking status: Never Smoker  . Smokeless tobacco: Never Used  . Alcohol use No  . Drug use: No  . Sexual activity: Not on file   Other Topics Concern  . 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    Walking 5 day per week.    Now  [facility] for a few years still has residence in [place]                Past Surgical History:  Procedure Laterality Date  . BIOPSY BREAST    . CESAREAN SECTION  [phone]    Family History  Problem Relation Age of Onset  . Diabetes Mother   .  Hypertension Mother   . Stroke Father   . Hypertension Father   . Heart disease Paternal Grandmother   . Stroke Brother   . Heart attack Neg Hx     No Known Allergies  Current Outpatient Prescriptions on File Prior to Visit  Medication Sig Dispense Refill  . amLODipine (NORVASC) 5 MG tablet TAKE 1 TABLET BY MOUTH EVERY DAY 90 tablet 1  . Ascorbic Acid (VITAMIN C) 1000 MG tablet Take 1,000 mg by mouth daily.    . Cholecalciferol (VITAMIN D3) 2000 UNITS TABS Take 1 tablet by mouth daily.    . nadolol (CORGARD) 20 MG tablet TAKE 1/2 TABLET BY MOUTH EVERY DAY 15 tablet 5  . pantoprazole (PROTONIX) 40 MG tablet TAKE 1 TABLET BY MOUTH EVERY DAY 30 tablet 1  . Psyllium (METAMUCIL PO) Take 1 packet by mouth daily.      No current facility-administered medications on file prior to visit.  BP (!) 146/80 (BP Location: Left Arm)   Temp 98.4 F (36.9 C) (Oral)   Wt 134 lb (60.8 kg)   BMI 24.12 kg/m       Objective:   Physical Exam  Constitutional: She is oriented to person, place, and time. She appears well-developed and well-nourished. No distress.  Cardiovascular: Normal rate, regular rhythm, normal heart sounds and intact distal pulses.  Exam reveals no gallop and no friction rub.   No murmur heard. Pulmonary/Chest: Effort normal and breath sounds normal. No respiratory distress. She has no wheezes. She has no rales. She exhibits no tenderness.  Abdominal: Soft. Bowel sounds are normal. She exhibits no distension and no mass. There is no tenderness. There is no rebound and no guarding.  Neurological: She is alert and oriented to person, place, and time.  Skin: Skin is warm and dry. No rash noted. She is not diaphoretic. No erythema. No pallor.  Psychiatric: She has a normal mood and affect. Her behavior is normal. Judgment and thought content normal.  Nursing note and vitals reviewed.     Assessment & Plan:  1. Epigastric pain - EKG 12-Lead- NSR, Rate 74.  - Pain likely from  GERD. Will get labs to r/o pancreatitis.  - Start taking Protonix as directed  - CBC with Differential/Platelet - Comprehensive metabolic panel - Lipase - Amylase - Alum & Mag Hydroxide-Simeth (GI COCKTAIL) SUSP suspension; Take 30 mLs by mouth 2 (two) times daily. Shake well.  Dispense: 180 mL; Refill: 0 - Follow up with PCP as needed   Dorothyann Peng, NP

## 2017-07-18 ENCOUNTER — Other Ambulatory Visit: Payer: Self-pay | Admitting: Obstetrics and Gynecology

## 2017-07-18 DIAGNOSIS — Z1231 Encounter for screening mammogram for malignant neoplasm of breast: Secondary | ICD-10-CM

## 2017-07-22 ENCOUNTER — Ambulatory Visit: Payer: BLUE CROSS/BLUE SHIELD

## 2017-07-29 ENCOUNTER — Encounter: Payer: Self-pay | Admitting: Internal Medicine

## 2017-08-02 ENCOUNTER — Ambulatory Visit
Admission: RE | Admit: 2017-08-02 | Discharge: 2017-08-02 | Disposition: A | Discharge status: Home / Self Care | Payer: BLUE CROSS/BLUE SHIELD | Source: Ambulatory Visit | Attending: Obstetrics and Gynecology | Admitting: Obstetrics and Gynecology

## 2017-08-02 DIAGNOSIS — Z1231 Encounter for screening mammogram for malignant neoplasm of breast: Secondary | ICD-10-CM

## 2017-08-04 ENCOUNTER — Ambulatory Visit (INDEPENDENT_AMBULATORY_CARE_PROVIDER_SITE_OTHER): Payer: BLUE CROSS/BLUE SHIELD | Admitting: Adult Health

## 2017-08-04 ENCOUNTER — Encounter: Payer: Self-pay | Admitting: Adult Health

## 2017-08-04 VITALS — BP 122/72 | Temp 98.7°F | Wt 138.0 lb

## 2017-08-04 DIAGNOSIS — H6123 Impacted cerumen, bilateral: Secondary | ICD-10-CM | POA: Diagnosis not present

## 2017-08-04 NOTE — Progress Notes (Signed)
Subjective:    Patient ID: Amanda Patterson, female    DOB: 06-25-1956, 61 y.o.   MRN: [id number]  HPI  61 year old female who  has a past medical history of BP (high blood pressure); Chicken pox; Headache(784.0); High cholesterol; varicella; and Murmur, heart. She presents to the office today for the complaint of " I can't hear". She denies any pain or drainage. Feels as though her ears are clogged.    Review of Systems See HPI   Past Medical History:  Diagnosis Date  . BP (high blood pressure)   . Chicken pox   . Headache(784.0)   . High cholesterol   . Hx of varicella   . Murmur, heart    MVP by hx  stress echo 2012 midl post prolapse valve    Social History   Social History  . Marital status: Married    Spouse name: N/A  . Number of children: N/A  . Years of education: N/A   Occupational History  . Not on file.   Social History Main Topics  . Smoking status: Never Smoker  . Smokeless tobacco: Never Used  . Alcohol use No  . Drug use: No  . Sexual activity: Not on file   Other Topics Concern  . 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    Walking 5 day per week.    Now  [facility] for a few years still has residence in [place]                Past Surgical History:  Procedure Laterality Date  . BIOPSY BREAST    . BREAST EXCISIONAL BIOPSY Bilateral    Approx. 15 years ago  . CESAREAN SECTION  [phone]    Family History  Problem Relation Age of Onset  . Diabetes Mother   . Hypertension Mother   . Stroke Father   . Hypertension Father   . Heart disease Paternal Grandmother   . Stroke Brother   . Heart attack Neg Hx   . Breast cancer Neg Hx     No Known Allergies  Current Outpatient Prescriptions on File  Prior to Visit  Medication Sig Dispense Refill  . amLODipine (NORVASC) 5 MG tablet TAKE 1 TABLET BY MOUTH EVERY DAY 90 tablet 1  . Ascorbic Acid (VITAMIN C) 1000 MG tablet Take 1,000 mg by mouth daily.    . Cholecalciferol (VITAMIN D3) 2000 UNITS TABS Take 1 tablet by mouth daily.    . nadolol (CORGARD) 20 MG tablet TAKE 1/2 TABLET BY MOUTH EVERY DAY 15 tablet 5  . Psyllium (METAMUCIL PO) Take 1 packet by mouth daily.      No current facility-administered medications on file prior to visit.     BP 122/72 (BP Location: Left Arm)   Temp 98.7 F (37.1 C) (Oral)   Wt 138 lb (62.6 kg)   BMI 24.84 kg/m       Objective:   Physical Exam  Constitutional: She appears well-developed and well-nourished. No distress.  HENT:  Bilateral cerumen impaction   Skin: Skin is warm and  dry. No rash noted. She is not diaphoretic. No erythema. No pallor.  Psychiatric: She has a normal mood and affect. Her behavior is normal. Judgment and thought content normal.  Nursing note and vitals reviewed.     Assessment & Plan:  1. Bilateral hearing loss due to cerumen impaction - Cerumen impactions were easily removed with irrigation  - TM's visualized and no signs of infection were noted  - Follow up as needed  Dorothyann Peng, NP

## 2017-09-15 ENCOUNTER — Ambulatory Visit (INDEPENDENT_AMBULATORY_CARE_PROVIDER_SITE_OTHER): Payer: BLUE CROSS/BLUE SHIELD

## 2017-09-15 DIAGNOSIS — Z23 Encounter for immunization: Secondary | ICD-10-CM | POA: Diagnosis not present

## 2017-09-27 ENCOUNTER — Other Ambulatory Visit: Payer: Self-pay | Admitting: Internal Medicine

## 2017-10-24 DIAGNOSIS — S335XXA Sprain of ligaments of lumbar spine, initial encounter: Secondary | ICD-10-CM | POA: Diagnosis not present

## 2017-11-16 ENCOUNTER — Other Ambulatory Visit: Payer: Self-pay | Admitting: Internal Medicine

## 2017-11-16 DIAGNOSIS — R7309 Other abnormal glucose: Secondary | ICD-10-CM | POA: Diagnosis not present

## 2017-11-16 DIAGNOSIS — Z01419 Encounter for gynecological examination (general) (routine) without abnormal findings: Secondary | ICD-10-CM | POA: Diagnosis not present

## 2017-11-16 DIAGNOSIS — R0789 Other chest pain: Secondary | ICD-10-CM

## 2017-11-16 DIAGNOSIS — I340 Nonrheumatic mitral (valve) insufficiency: Secondary | ICD-10-CM

## 2017-11-16 DIAGNOSIS — Z6825 Body mass index (BMI) 25.0-25.9, adult: Secondary | ICD-10-CM | POA: Diagnosis not present

## 2017-11-16 DIAGNOSIS — I341 Nonrheumatic mitral (valve) prolapse: Secondary | ICD-10-CM

## 2017-11-16 LAB — BASIC METABOLIC PANEL: Glucose: 97

## 2017-11-16 LAB — HEMOGLOBIN A1C: HEMOGLOBIN A1C: 6

## 2017-11-17 DIAGNOSIS — H40053 Ocular hypertension, bilateral: Secondary | ICD-10-CM | POA: Diagnosis not present

## 2017-11-17 NOTE — Telephone Encounter (Signed)
Medication filled to pharmacy as requested.   

## 2017-12-06 ENCOUNTER — Encounter: Payer: Self-pay | Admitting: Internal Medicine

## 2018-03-06 ENCOUNTER — Other Ambulatory Visit: Payer: Self-pay | Admitting: Internal Medicine

## 2018-03-06 ENCOUNTER — Telehealth: Payer: Self-pay | Admitting: Internal Medicine

## 2018-03-06 NOTE — Telephone Encounter (Signed)
yes

## 2018-03-06 NOTE — Telephone Encounter (Signed)
Okay for Shingrix.

## 2018-03-06 NOTE — Telephone Encounter (Signed)
Copied from [place] [id number]. Topic: Quick Communication - See Telephone Encounter >> Mar 06, 2018 12:12 PM Cleaster Corin, NT wrote: CRM for notification. See Telephone encounter for: 03/06/18.  Pt. Requesting 1st dosage of shingrix vaccine

## 2018-03-07 NOTE — Telephone Encounter (Signed)
Called patient and left message to return call to schedule nurse visit for Shingrix injection. When patient returns call, ok to schedule nurse visit. Please let me know if/when appt is scheduled. 

## 2018-03-09 ENCOUNTER — Ambulatory Visit (INDEPENDENT_AMBULATORY_CARE_PROVIDER_SITE_OTHER): Payer: BLUE CROSS/BLUE SHIELD | Admitting: *Deleted

## 2018-03-09 DIAGNOSIS — Z23 Encounter for immunization: Secondary | ICD-10-CM

## 2018-03-09 NOTE — Progress Notes (Signed)
Per orders of Dr. Panosh, injection of Shingrix given by Sammie Schermerhorn J Latrisha Coiro. Patient tolerated injection well. 

## 2018-05-14 ENCOUNTER — Other Ambulatory Visit: Payer: Self-pay | Admitting: Internal Medicine

## 2018-05-14 DIAGNOSIS — I341 Nonrheumatic mitral (valve) prolapse: Secondary | ICD-10-CM

## 2018-05-14 DIAGNOSIS — I340 Nonrheumatic mitral (valve) insufficiency: Secondary | ICD-10-CM

## 2018-05-14 DIAGNOSIS — R0789 Other chest pain: Secondary | ICD-10-CM

## 2018-06-06 ENCOUNTER — Encounter: Payer: Self-pay | Admitting: Internal Medicine

## 2018-06-06 ENCOUNTER — Ambulatory Visit (INDEPENDENT_AMBULATORY_CARE_PROVIDER_SITE_OTHER): Payer: BLUE CROSS/BLUE SHIELD | Admitting: Internal Medicine

## 2018-06-06 VITALS — BP 110/70 | HR 61 | Temp 98.1°F | Ht 62.4 in | Wt 134.7 lb

## 2018-06-06 DIAGNOSIS — I341 Nonrheumatic mitral (valve) prolapse: Secondary | ICD-10-CM | POA: Diagnosis not present

## 2018-06-06 DIAGNOSIS — Z8632 Personal history of gestational diabetes: Secondary | ICD-10-CM | POA: Diagnosis not present

## 2018-06-06 DIAGNOSIS — Z79899 Other long term (current) drug therapy: Secondary | ICD-10-CM

## 2018-06-06 DIAGNOSIS — Z1159 Encounter for screening for other viral diseases: Secondary | ICD-10-CM

## 2018-06-06 DIAGNOSIS — Z0000 Encounter for general adult medical examination without abnormal findings: Secondary | ICD-10-CM

## 2018-06-06 DIAGNOSIS — I10 Essential (primary) hypertension: Secondary | ICD-10-CM | POA: Diagnosis not present

## 2018-06-06 LAB — BASIC METABOLIC PANEL
BUN: 9 mg/dL (ref 6–23)
CALCIUM: 9.6 mg/dL (ref 8.4–10.5)
CO2: 31 meq/L (ref 19–32)
CREATININE: 0.77 mg/dL (ref 0.40–1.20)
Chloride: 102 mEq/L (ref 96–112)
GFR: 98.16 mL/min (ref >60.00)
Glucose, Bld: 100 mg/dL — ABNORMAL HIGH (ref 70–99)
Potassium: 4.6 mEq/L (ref 3.5–5.1)
SODIUM: 139 meq/L (ref 135–145)

## 2018-06-06 LAB — HEPATIC FUNCTION PANEL
ALK PHOS: 57 U/L (ref 39–117)
ALT: 18 U/L (ref 0–35)
AST: 16 U/L (ref 0–37)
Albumin: 4.2 g/dL (ref 3.5–5.2)
BILIRUBIN DIRECT: 0.1 mg/dL (ref 0.0–0.3)
TOTAL PROTEIN: 7.2 g/dL (ref 6.0–8.3)
Total Bilirubin: 0.3 mg/dL (ref 0.2–1.2)

## 2018-06-06 LAB — CBC WITH DIFFERENTIAL/PLATELET
BASOS ABS: 0.1 10*3/uL (ref 0.0–0.1)
Basophils Relative: 0.8 % (ref 0.0–3.0)
EOS ABS: 0.1 10*3/uL (ref 0.0–0.7)
Eosinophils Relative: 1.2 % (ref 0.0–5.0)
HCT: 40.7 % (ref 36.0–46.0)
Hemoglobin: 13.7 g/dL (ref 12.0–15.0)
Lymphocytes Relative: 20.7 % (ref 12.0–46.0)
Lymphs Abs: 1.6 10*3/uL (ref 0.7–4.0)
MCHC: 33.6 g/dL (ref 30.0–36.0)
MCV: 86.5 fl (ref 78.0–100.0)
Monocytes Absolute: 0.4 10*3/uL (ref 0.1–1.0)
Monocytes Relative: 4.8 % (ref 3.0–12.0)
NEUTROS ABS: 5.7 10*3/uL (ref 1.4–7.7)
NEUTROS PCT: 72.5 % (ref 43.0–77.0)
PLATELETS: 319 10*3/uL (ref 150.0–400.0)
RBC: 4.7 Mil/uL (ref 3.87–5.11)
RDW: 14.6 % (ref 11.5–15.5)
WBC: 7.9 10*3/uL (ref 4.0–10.5)

## 2018-06-06 LAB — LIPID PANEL
CHOL/HDL RATIO: 4
Cholesterol: 191 mg/dL (ref 0–200)
HDL: 51.6 mg/dL (ref >39.00)
LDL Cholesterol: 109 mg/dL — ABNORMAL HIGH (ref 0–99)
NONHDL: 139.74
Triglycerides: 154 mg/dL — ABNORMAL HIGH (ref 0.0–149.0)
VLDL: 30.8 mg/dL (ref 0.0–40.0)

## 2018-06-06 LAB — TSH: TSH: 1.8 u[IU]/mL (ref 0.35–4.50)

## 2018-06-06 LAB — HEMOGLOBIN A1C: Hgb A1c MFr Bld: 6.4 % (ref 4.6–6.5)

## 2018-06-06 NOTE — Patient Instructions (Signed)
Exam is good today.    Continue lifestyle intervention healthy eating and exercise .  Will notify you  of labs when available.  If all k then yearly  CPX and labs    Preventive Care 40-64 Years, Female Preventive care refers to lifestyle choices and visits with your health care provider that can promote health and wellness. What does preventive care include?  A yearly physical exam. This is also called an annual well check.  Dental exams once or twice a year.  Routine eye exams. Ask your health care provider how often you should have your eyes checked.  Personal lifestyle choices, including: ? Daily care of your teeth and gums. ? Regular physical activity. ? Eating a healthy diet. ? Avoiding tobacco and drug use. ? Limiting alcohol use. ? Practicing safe sex. ? Taking low-dose aspirin daily starting at age 26. ? Taking vitamin and mineral supplements as recommended by your health care provider. What happens during an annual well check? The services and screenings done by your health care provider during your annual well check will depend on your age, overall health, lifestyle risk factors, and family history of disease. Counseling Your health care provider may ask you questions about your:  Alcohol use.  Tobacco use.  Drug use.  Emotional well-being.  Home and relationship well-being.  Sexual activity.  Eating habits.  Work and work Statistician.  Method of birth control.  Menstrual cycle.  Pregnancy history.  Screening You may have the following tests or measurements:  Height, weight, and BMI.  Blood pressure.  Lipid and cholesterol levels. These may be checked every 5 years, or more frequently if you are over 18 years old.  Skin check.  Lung cancer screening. You may have this screening every year starting at age 66 if you have a 30-pack-year history of smoking and currently smoke or have quit within the past 15 years.  Fecal occult blood test  (FOBT) of the stool. You may have this test every year starting at age 18.  Flexible sigmoidoscopy or colonoscopy. You may have a sigmoidoscopy every 5 years or a colonoscopy every 10 years starting at age 53.  Hepatitis C blood test.  Hepatitis B blood test.  Sexually transmitted disease (STD) testing.  Diabetes screening. This is done by checking your blood sugar (glucose) after you have not eaten for a while (fasting). You may have this done every 1-3 years.  Mammogram. This may be done every 1-2 years. Talk to your health care provider about when you should start having regular mammograms. This may depend on whether you have a family history of breast cancer.  BRCA-related cancer screening. This may be done if you have a family history of breast, ovarian, tubal, or peritoneal cancers.  Pelvic exam and Pap test. This may be done every 3 years starting at age 27. Starting at age 62, this may be done every 5 years if you have a Pap test in combination with an HPV test.  Bone density scan. This is done to screen for osteoporosis. You may have this scan if you are at high risk for osteoporosis.  Discuss your test results, treatment options, and if necessary, the need for more tests with your health care provider. Vaccines Your health care provider may recommend certain vaccines, such as:  Influenza vaccine. This is recommended every year.  Tetanus, diphtheria, and acellular pertussis (Tdap, Td) vaccine. You may need a Td booster every 10 years.  Varicella vaccine. You may need this  if you have not been vaccinated.  Zoster vaccine. You may need this after age 58.  Measles, mumps, and rubella (MMR) vaccine. You may need at least one dose of MMR if you were born in 1957 or later. You may also need a second dose.  Pneumococcal 13-valent conjugate (PCV13) vaccine. You may need this if you have certain conditions and were not previously vaccinated.  Pneumococcal polysaccharide (PPSV23)  vaccine. You may need one or two doses if you smoke cigarettes or if you have certain conditions.  Meningococcal vaccine. You may need this if you have certain conditions.  Hepatitis A vaccine. You may need this if you have certain conditions or if you travel or work in places where you may be exposed to hepatitis A.  Hepatitis B vaccine. You may need this if you have certain conditions or if you travel or work in places where you may be exposed to hepatitis B.  Haemophilus influenzae type b (Hib) vaccine. You may need this if you have certain conditions.  Talk to your health care provider about which screenings and vaccines you need and how often you need them. This information is not intended to replace advice given to you by your health care provider. Make sure you discuss any questions you have with your health care provider. Document Released: 11/21/2015 Document Revised: 07/14/2016 Document Reviewed: 08/26/2015 Elsevier Interactive Patient Education  [employer].

## 2018-06-06 NOTE — Progress Notes (Signed)
Chief Complaint  Patient presents with  . Annual Exam    No new concerns    HPI: Patient  Amanda Patterson  62 y.o. comes in today for Preventive Health Care visit   Npo acid blocker  At this  time tums if needed.    No racing heart  Sx.   Is a bit sleep deprived  After 7 weeks of  Conferences but ok  No exercise limitations otherwise  Trying  More plant based no red meat    Min to no chicken but fish and other .   Sees cards about yearly  No sx  Has had first shingrix   Health Maintenance  Topic Date Due  . Hepatitis C Screening  1956/05/29  . HIV Screening  11/21/1972  . INFLUENZA VACCINE  06/08/2018  . MAMMOGRAM  08/03/2019  . COLONOSCOPY  04/21/2020  . PAP SMEAR  11/16/2020  . TETANUS/TDAP  02/28/2024   Health Maintenance Review LIFESTYLE:  Exercise:   Working at it.  Tobacco/ETS: no Alcohol:  no Sugar beverages: no Sleep:les for 7 weeks.  Drug use: no HH of  2+  Work:    ROS:  ocass  Back stiffness no pain or radiation GEN/ HEENT: No fever, significant weight changes sweats headaches vision problems hearing changes, CV/ PULM; No chest pain shortness of breath cough, syncope,edema  change in exercise tolerance. GI /GU: No adominal pain, vomiting, change in bowel habits. No blood in the stool. No significant GU symptoms. SKIN/HEME: ,no acute skin rashes suspicious lesions or bleeding. No lymphadenopathy, nodules, masses.  NEURO/ PSYCH:  No neurologic signs such as weakness numbness. No depression anxiety. IMM/ Allergy: No unusual infections.  Allergy .   REST of 12 system review negative except as per HPI   Past Medical History:  Diagnosis Date  . BP (high blood pressure)   . Chicken pox   . Headache(784.0)   . High cholesterol   . Hx of varicella   . Murmur, heart    MVP by hx  stress echo 2012 midl post prolapse valve    Past Surgical History:  Procedure Laterality Date  . BIOPSY BREAST    . BREAST EXCISIONAL BIOPSY Bilateral    Approx. 15 years  ago  . CESAREAN SECTION  [phone]    Family History  Problem Relation Age of Onset  . Diabetes Mother   . Hypertension Mother   . Stroke Father   . Hypertension Father   . Heart disease Paternal Grandmother   . Stroke Brother   . Heart attack Neg Hx   . Breast cancer Neg Hx     Social History   Socioeconomic History  . Marital status: Married    Spouse name: Not on file  . Number of children: Not on file  . Years of education: Not on file  . Highest education level: Not on file  Occupational History  . Not on file  Social Needs  . Financial resource strain: Not on file  . Food insecurity:    Worry: Not on file    Inability: Not on file  . Transportation needs:    Medical: Not on file    Non-medical: Not on file  Tobacco Use  . Smoking status: Never Smoker  . Smokeless tobacco: Never Used  Substance and Sexual Activity  . Alcohol use: No  . Drug use: No  . Sexual activity: Not on file  Lifestyle  . Physical activity:    Days per week:  Not on file    Minutes per session: Not on file  . Stress: Not on file  Relationships  . Social connections:    Talks on phone: Not on file    Gets together: Not on file    Attends religious service: Not on file    Active member of club or organization: Not on file    Attends meetings of clubs or organizations: Not on file    Relationship status: Not on file  Other Topics Concern  . 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    Walking 5 day per week.    Now  [place] for a few years still has residence in [place]:  BP 110/70 (BP Location: Right Arm, Patient Position: Sitting, Cuff Size: Normal)   Pulse 61   Temp 98.1 F (36.7 C) (Oral)   Ht 5' 2.4"  (1.585 m)   Wt 134 lb 11.2 oz (61.1 kg)   BMI 24.32 kg/m   Body mass index is 24.32 kg/m. Wt Readings from Last 3 Encounters:  06/06/18 134 lb 11.2 oz (61.1 kg)  08/04/17 138 lb (62.6 kg)  06/17/17 134 lb (60.8 kg)    Physical Exam: Vital signs reviewed YWV:PXTG is a well-developed well-nourished alert cooperative    who appearsr stated age in no acute distress.  HEENT: normocephalic atraumatic , Eyes: PERRL EOM's full, conjunctiva clear, Nares: paten,t no deformity discharge or tenderness., Ears: no deformity EAC's clear TMs with normal landmarks. Mouth: clear OP, no lesions, edema.  Moist mucous membranes. Dentition in adequate repair. NECK: supple without masses, thyromegaly or bruits. CHEST/PULM:  Clear to auscultation and percussion breath sounds equal no wheeze , rales or rhonchi. No chest wall deformities or tenderness. Breast: normal by inspection . No dimpling, discharge, masses, tenderness or discharge . CV: PMI is nondisplaced, S1 S2 no gallops, murmurs, rubs.  Discrete  Crisp mid systolic click with tout murmur  In all positions.  Peripheral pulses are full without delay.No JVD .  ABDOMEN: Bowel sounds normal nontender  No guard or rebound, no hepato splenomegal no CVA tenderness.  No hernia. Extremtities:  No clubbing cyanosis or edema, no acute joint swelling or redness no focal atrophy NEURO:  Oriented x3, cranial nerves 3-12 appear to be intact, no obvious focal weakness,gait within normal limits no abnormal reflexes or asymmetrical SKIN: No acute rashes normal turgor, color, no bruising or petechiae. PSYCH: Oriented, good eye contact, no obvious depression anxiety, cognition and judgment appear normal. LN: no cervical axillary inguinal adenopathy  Lab Results  Component Value Date   WBC 6.0 06/17/2017   HGB 14.3 06/17/2017   HCT 43.9 06/17/2017   PLT 293.0 06/17/2017   GLUCOSE 111 (H) 06/17/2017   CHOL 186 04/08/2017   TRIG 117.0 04/08/2017   HDL 50.40 04/08/2017    LDLDIRECT 145.4 08/21/2012   LDLCALC 112 (H) 04/08/2017   ALT 19 06/17/2017   AST 17 06/17/2017   NA 139 06/17/2017   K 4.4 06/17/2017   CL 103  06/17/2017   CREATININE 0.80 06/17/2017   BUN 9 06/17/2017   CO2 31 06/17/2017   TSH 1.47 03/03/2016   HGBA1C 6.0 11/16/2017    BP Readings from Last 3 Encounters:  06/06/18 110/70  08/04/17 122/72  06/17/17 (!) 146/80    Wt Readings from Last 3 Encounters:  06/06/18 134 lb 11.2 oz (61.1 kg)  08/04/17 138 lb (62.6 kg)  06/17/17 134 lb (60.8 kg)      ASSESSMENT AND PLAN:  Discussed the following assessment and plan:  Visit for preventive health examination - Plan: Basic metabolic panel, CBC with Differential/Platelet, Hepatic function panel, Lipid panel, TSH, Hepatitis C antibody, Hemoglobin A1c  Essential hypertension - Plan: Basic metabolic panel, CBC with Differential/Platelet, Hepatic function panel, Lipid panel, TSH  Medication management - Plan: Basic metabolic panel, CBC with Differential/Platelet, Hepatic function panel, Lipid panel, TSH  MVP (mitral valve prolapse) - Plan: Basic metabolic panel, CBC with Differential/Platelet, Hepatic function panel, Lipid panel, TSH  Need for hepatitis C screening test - Plan: Hepatitis C antibody  Hx gestational diabetes - Plan: Hemoglobin A1c Continue lifestyle intervention healthy eating and exercise . Follow  Etc  Lab today if all ok then yearly cpx  Patient Care Team: Panosh, Standley Brooking, MD as PCP - General (Internal Medicine) Arvella Nigh, MD (Obstetrics and Gynecology) Lewie Loron, [place] as Physician Assistant (Chiropractic Medicine) [name], MD as Consulting Physician (Cardiology) Patient Instructions  Exam is good today.    Continue lifestyle intervention healthy eating and exercise .  Will notify you  of labs when available.  If all k then yearly  CPX and labs    Preventive Care 40-64 Years, Female Preventive care refers to lifestyle choices and visits  with your health care provider that can promote health and wellness. What does preventive care include?  A yearly physical exam. This is also called an annual well check.  Dental exams once or twice a year.  Routine eye exams. Ask your health care provider how often you should have your eyes checked.  Personal lifestyle choices, including: ? Daily care of your teeth and gums. ? Regular physical activity. ? Eating a healthy diet. ? Avoiding tobacco and drug use. ? Limiting alcohol use. ? Practicing safe sex. ? Taking low-dose aspirin daily starting at age 30. ? Taking vitamin and mineral supplements as recommended by your health care provider. What happens during an annual well check? The services and screenings done by your health care provider during your annual well check will depend on your age, overall health, lifestyle risk factors, and family history of disease. Counseling Your health care provider may ask you questions about your:  Alcohol use.  Tobacco use.  Drug use.  Emotional well-being.  Home and relationship well-being.  Sexual activity.  Eating habits.  Work and work Statistician.  Method of birth control.  Menstrual cycle.  Pregnancy history.  Screening You may have the following tests or measurements:  Height, weight, and BMI.  Blood pressure.  Lipid and cholesterol levels. These may be checked every 5 years, or more frequently if you are over 13 years old.  Skin check.  Lung cancer screening. You may have this screening every year starting at age 89 if you have a 30-pack-year history of smoking and currently smoke or have quit within the past 15 years.  Fecal occult blood test (FOBT) of the stool. You may have this test every year starting at age 30.  Flexible sigmoidoscopy or colonoscopy. You may  have a sigmoidoscopy every 5 years or a colonoscopy every 10 years starting at age 19.  Hepatitis C blood test.  Hepatitis B blood  test.  Sexually transmitted disease (STD) testing.  Diabetes screening. This is done by checking your blood sugar (glucose) after you have not eaten for a while (fasting). You may have this done every 1-3 years.  Mammogram. This may be done every 1-2 years. Talk to your health care provider about when you should start having regular mammograms. This may depend on whether you have a family history of breast cancer.  BRCA-related cancer screening. This may be done if you have a family history of breast, ovarian, tubal, or peritoneal cancers.  Pelvic exam and Pap test. This may be done every 3 years starting at age 18. Starting at age 78, this may be done every 5 years if you have a Pap test in combination with an HPV test.  Bone density scan. This is done to screen for osteoporosis. You may have this scan if you are at high risk for osteoporosis.  Discuss your test results, treatment options, and if necessary, the need for more tests with your health care provider. Vaccines Your health care provider may recommend certain vaccines, such as:  Influenza vaccine. This is recommended every year.  Tetanus, diphtheria, and acellular pertussis (Tdap, Td) vaccine. You may need a Td booster every 10 years.  Varicella vaccine. You may need this if you have not been vaccinated.  Zoster vaccine. You may need this after age 2.  Measles, mumps, and rubella (MMR) vaccine. You may need at least one dose of MMR if you were born in 1957 or later. You may also need a second dose.  Pneumococcal 13-valent conjugate (PCV13) vaccine. You may need this if you have certain conditions and were not previously vaccinated.  Pneumococcal polysaccharide (PPSV23) vaccine. You may need one or two doses if you smoke cigarettes or if you have certain conditions.  Meningococcal vaccine. You may need this if you have certain conditions.  Hepatitis A vaccine. You may need this if you have certain conditions or if you  travel or work in places where you may be exposed to hepatitis A.  Hepatitis B vaccine. You may need this if you have certain conditions or if you travel or work in places where you may be exposed to hepatitis B.  Haemophilus influenzae type b (Hib) vaccine. You may need this if you have certain conditions.  Talk to your health care provider about which screenings and vaccines you need and how often you need them. This information is not intended to replace advice given to you by your health care provider. Make sure you discuss any questions you have with your health care provider. Document Released: 11/21/2015 Document Revised: 07/14/2016 Document Reviewed: 08/26/2015 Elsevier Interactive Patient Education  2018 [place]. Panosh M.D.

## 2018-06-07 LAB — HEPATITIS C ANTIBODY
HEP C AB: NONREACTIVE
SIGNAL TO CUT-OFF: 0.01 (ref <1.00)

## 2018-07-18 ENCOUNTER — Other Ambulatory Visit: Payer: Self-pay | Admitting: Internal Medicine

## 2018-08-09 ENCOUNTER — Other Ambulatory Visit: Payer: Self-pay | Admitting: Internal Medicine

## 2018-08-09 DIAGNOSIS — Z1231 Encounter for screening mammogram for malignant neoplasm of breast: Secondary | ICD-10-CM

## 2018-08-14 ENCOUNTER — Ambulatory Visit
Admission: RE | Admit: 2018-08-14 | Discharge: 2018-08-14 | Disposition: A | Discharge status: Home / Self Care | Payer: BLUE CROSS/BLUE SHIELD | Source: Ambulatory Visit | Attending: Internal Medicine | Admitting: Internal Medicine

## 2018-08-14 DIAGNOSIS — Z1231 Encounter for screening mammogram for malignant neoplasm of breast: Secondary | ICD-10-CM | POA: Diagnosis not present

## 2018-08-18 ENCOUNTER — Other Ambulatory Visit: Payer: Self-pay | Admitting: Internal Medicine

## 2018-08-18 DIAGNOSIS — I341 Nonrheumatic mitral (valve) prolapse: Secondary | ICD-10-CM

## 2018-08-18 DIAGNOSIS — R0789 Other chest pain: Secondary | ICD-10-CM

## 2018-08-18 DIAGNOSIS — I340 Nonrheumatic mitral (valve) insufficiency: Secondary | ICD-10-CM

## 2018-10-03 ENCOUNTER — Encounter: Payer: Self-pay | Admitting: Family Medicine

## 2018-10-03 ENCOUNTER — Ambulatory Visit: Payer: BLUE CROSS/BLUE SHIELD | Admitting: Family Medicine

## 2018-10-03 VITALS — BP 110/60 | HR 64 | Temp 98.4°F | Wt 138.9 lb

## 2018-10-03 DIAGNOSIS — J029 Acute pharyngitis, unspecified: Secondary | ICD-10-CM | POA: Diagnosis not present

## 2018-10-03 DIAGNOSIS — J020 Streptococcal pharyngitis: Secondary | ICD-10-CM

## 2018-10-03 LAB — POCT RAPID STREP A (OFFICE): Rapid Strep A Screen: POSITIVE — AB

## 2018-10-03 MED ORDER — PANTOPRAZOLE SODIUM 40 MG PO TBEC: 40.0000 mg | DELAYED_RELEASE_TABLET | Freq: Every day | ORAL | 3 refills | Status: DC

## 2018-10-03 MED ORDER — AMOXICILLIN 875 MG PO TABS: 875.0000 mg | ORAL_TABLET | Freq: Two times a day (BID) | ORAL | 0 refills | Status: DC

## 2018-10-03 NOTE — Patient Instructions (Signed)
Strep Throat Strep throat is a bacterial infection of the throat. Your health care provider may call the infection tonsillitis or pharyngitis, depending on whether there is swelling in the tonsils or at the back of the throat. Strep throat is most common during the cold months of the year in children who are 5-60 years of age, but it can happen during any season in people of any age. This infection is spread from person to person (contagious) through coughing, sneezing, or close contact. What are the causes? Strep throat is caused by the bacteria called Streptococcus pyogenes. What increases the risk? This condition is more likely to develop in:  People who spend time in crowded places where the infection can spread easily.  People who have close contact with someone who has strep throat.  What are the signs or symptoms? Symptoms of this condition include:  Fever or chills.  Redness, swelling, or pain in the tonsils or throat.  Pain or difficulty when swallowing.  White or yellow spots on the tonsils or throat.  Swollen, tender glands in the neck or under the jaw.  Red rash all over the body (rare).  How is this diagnosed? This condition is diagnosed by performing a rapid strep test or by taking a swab of your throat (throat culture test). Results from a rapid strep test are usually ready in a few minutes, but throat culture test results are available after one or two days. How is this treated? This condition is treated with antibiotic medicine. Follow these instructions at home: Medicines  Take over-the-counter and prescription medicines only as told by your health care provider.  Take your antibiotic as told by your health care provider. Do not stop taking the antibiotic even if you start to feel better.  Have family members who also have a sore throat or fever tested for strep throat. They may need antibiotics if they have the strep infection. Eating and drinking  Do not  share food, drinking cups, or personal items that could cause the infection to spread to other people.  If swallowing is difficult, try eating soft foods until your sore throat feels better.  Drink enough fluid to keep your urine clear or pale yellow. General instructions  Gargle with a salt-water mixture 3-4 times per day or as needed. To make a salt-water mixture, completely dissolve -1 tsp of salt in 1 cup of warm water.  Make sure that all household members wash their hands well.  Get plenty of rest.  Stay home from school or work until you have been taking antibiotics for 24 hours.  Keep all follow-up visits as told by your health care provider. This is important. Contact a health care provider if:  The glands in your neck continue to get bigger.  You develop a rash, cough, or earache.  You cough up a thick liquid that is green, yellow-brown, or bloody.  You have pain or discomfort that does not get better with medicine.  Your problems seem to be getting worse rather than better.  You have a fever. Get help right away if:  You have new symptoms, such as vomiting, severe headache, stiff or painful neck, chest pain, or shortness of breath.  You have severe throat pain, drooling, or changes in your voice.  You have swelling of the neck, or the skin on the neck becomes red and tender.  You have signs of dehydration, such as fatigue, dry mouth, and decreased urination.  You become increasingly sleepy, or   you cannot wake up completely.  Your joints become red or painful. This information is not intended to replace advice given to you by your health care provider. Make sure you discuss any questions you have with your health care provider. Document Released: 10/22/2000 Document Revised: 06/23/2016 Document Reviewed: 02/17/2015 Elsevier Interactive Patient Education  2018 [employer]  

## 2018-10-03 NOTE — Progress Notes (Signed)
  Subjective:     Patient ID: Amanda Patterson, female   DOB: 06-May-1956, 62 y.o.   MRN: [id number]  HPI Patient seen with 3-day history of sore throat.  She has had some postnasal drip.  She also has had frequent GERD symptoms and thinks some of her sore throat may be related to that.  Rare cough.  No fever.  No sick contacts.  Denies any facial pain or headaches.  She has long-standing history of reflux.  Has taken Protonix in the past and requesting refill.  She does not take this regularly.  She takes this for breakthrough symptoms when over-the-counter medications have not worked.  Denies any recent dysphagia.  No appetite or weight changes.  Past Medical History:  Diagnosis Date  . BP (high blood pressure)   . Chicken pox   . Headache(784.0)   . High cholesterol   . Hx of varicella   . Murmur, heart    MVP by hx  stress echo 2012 midl post prolapse valve  . Pain in the chest 10/22/2014   poss gi related  uncertain has risk factors  cont protonix cards check rov in 1-2 months    Past Surgical History:  Procedure Laterality Date  . BIOPSY BREAST    . BREAST EXCISIONAL BIOPSY Bilateral    Approx. 15 years ago  . CESAREAN SECTION  [id number]    reports that she has never smoked. She has never used smokeless tobacco. She reports that she does not drink alcohol or use drugs. family history includes Diabetes in her mother; Heart disease in her paternal grandmother; Hypertension in her father and mother; Stroke in her brother and father. No Known Allergies   Review of Systems  Constitutional: Negative for chills and fever.  HENT: Positive for postnasal drip and sore throat. Negative for ear pain, sinus pressure, sinus pain and trouble swallowing.   Respiratory: Negative for cough and shortness of breath.        Objective:   Physical Exam  Constitutional: She appears well-developed and well-nourished.  HENT:  Cerumen left canal obscuring eardrum  Erythema posterior pharynx.  No  exudate.  Neck: Neck supple.  Minimal anterior cervical adenopathy  Cardiovascular: Normal rate and regular rhythm.  Pulmonary/Chest: Effort normal and breath sounds normal.       Assessment:     Sore throat.  Rapid strep positive    Plan:     -Refilled Protonix for as needed use -Start amoxicillin 875 mg twice daily for 10 days -Follow-up for any persistent or worsening symptoms  Eulas Post MD [facility]

## 2018-10-10 ENCOUNTER — Other Ambulatory Visit: Payer: Self-pay | Admitting: Internal Medicine

## 2018-10-10 MED ORDER — NADOLOL 20 MG PO TABS: 10.0000 mg | ORAL_TABLET | Freq: Every day | ORAL | 0 refills | Status: DC

## 2018-10-10 NOTE — Telephone Encounter (Signed)
Copied from [place] [phone]. Topic: Quick Communication - Rx Refill/Question >> Oct 10, 2018 11:30 AM Scherrie Gerlach wrote: Medication: nadolol (CORGARD) 20 MG tablet 45 tabs  Has the patient contacted their pharmacy? yes Pt following up on request the pharmacy states they sent, but I do not see anything. [id number] - [place], [place] - [address] [phone] (Phone) [phone] (Fax)

## 2018-10-13 ENCOUNTER — Other Ambulatory Visit: Payer: Self-pay | Admitting: Internal Medicine

## 2018-11-07 ENCOUNTER — Telehealth: Payer: Self-pay

## 2018-11-07 NOTE — Telephone Encounter (Signed)
Copied from [place] [id number]. Topic: Referral - Request for Referral >> Nov 07, 2018  1:10 PM Judyann Munson wrote: Has patient seen PCP for this complaint? yes  *If NO, is insurance requiring patient see PCP for this issue before PCP can refer them? Referral for which specialty: Ear,nose and throat  Preferred provider/office:  located in [place]  Reason for referral: Throat concerns- pain and soreness

## 2018-11-09 NOTE — Telephone Encounter (Signed)
Advise OV?

## 2018-11-09 NOTE — Telephone Encounter (Signed)
Pt seen 10/03/18 for sore throat Assessment:     Sore throat.  Rapid strep positive    Plan:     -Refilled Protonix for as needed use -Start amoxicillin 875 mg twice daily for 10 days -Follow-up for any persistent or worsening symptoms  Eulas Post MD [facility]   Pt is now requesting a referral to ENT for ongoing throat pain. Please advise.

## 2018-11-13 NOTE — Telephone Encounter (Signed)
Left detailed message to return call - needs OV to discuss sore throat and possible referral to ENT

## 2018-11-19 NOTE — Progress Notes (Signed)
Chief Complaint  Patient presents with  . Sore Throat    off and on for a couple of weeks pt states it feels itchy and that sometimes it feels like someone is "choking" her . Pt states that one day she cleared her throat and that blood came out once     HPI: Amanda Patterson 63 y.o. come in for ongoing problem with throat   Seen for positive strep tests pharyngitis with  Poss reflux  In November  And  Improve within days  On amox and limited protonix  This  The restarted last week .  Feels raw    5/10  No fever  Had a week of sx and then came in n  Onceon  antibiotic improved    After 2nd day   Then better    ? No fever.  Feels  liek somethinkg  is upper lower neck  Not to swallow better with swalling.  ? Thyroid  Not coughing    Reeling like a choking feeling .  Pain raw feeling.  Neck itching ant  tylneol  Pain 5/10 wasn't to make sure nothing  Serious is going on and had asked for ent evalutaion( see phone notes)   Stopped the protonix when she finished the amoxicillin and was all better  ( see nov notes)  ROS: See pertinent positives and negatives per HPI. Bi currebnt fever  Exposures  Dysphagia   Remote hx of egd in her 30s for acid relux but no ongoing dyphagia   Past Medical History:  Diagnosis Date  . BP (high blood pressure)   . Chicken pox   . Headache(784.0)   . High cholesterol   . Hx of varicella   . Murmur, heart    MVP by hx  stress echo 2012 midl post prolapse valve  . Pain in the chest 10/22/2014   poss gi related  uncertain has risk factors  cont protonix cards check rov in 1-2 months     Family History  Problem Relation Age of Onset  . Diabetes Mother   . Hypertension Mother   . Stroke Father   . Hypertension Father   . Heart disease Paternal Grandmother   . Stroke Brother   . Heart attack Neg Hx   . Breast cancer Neg Hx     Social History   Socioeconomic History  . Marital status: Married    Spouse name: Not on file  . Number of children: Not on  file  . Years of education: Not on file  . Highest education level: Not on file  Occupational History  . Not on file  Social Needs  . Financial resource strain: Not on file  . Food insecurity:    Worry: Not on file    Inability: Not on file  . Transportation needs:    Medical: Not on file    Non-medical: Not on file  Tobacco Use  . Smoking status: Never Smoker  . Smokeless tobacco: Never Used  Substance and Sexual Activity  . Alcohol use: No  . Drug use: No  . Sexual activity: Not on file  Lifestyle  . Physical activity:    Days per week: Not on file    Minutes per session: Not on file  . Stress: Not on file  Relationships  . Social connections:    Talks on phone: Not on file    Gets together: Not on file    Attends religious service: Not on file  Active member of club or organization: Not on file    Attends meetings of clubs or organizations: Not on file    Relationship status: Not on file  Other Topics Concern  . 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    Walking 5 day per week.    Now  [place] years still has residence in [place] Medications Prior to Visit  Medication Sig Dispense Refill  . amLODipine (NORVASC) 5 MG tablet TAKE 1 TABLET BY MOUTH EVERY DAY 90 tablet 2  . Ascorbic Acid (VITAMIN C) 1000 MG tablet Take 1,000 mg by mouth daily.    . Cholecalciferol (VITAMIN D3) 2000 UNITS TABS Take 1 tablet by mouth daily.    . nadolol (CORGARD) 20 MG tablet Take 0.5 tablets (10 mg total) by mouth daily. 45 tablet 0  . nadolol (CORGARD) 20 MG tablet TAKE 1/2 TABLET BY MOUTH EVERY DAY 45 tablet 0  . pantoprazole (PROTONIX) 40 MG tablet Take 1 tablet (40 mg total) by mouth daily. 30 tablet 3    . Psyllium (METAMUCIL PO) Take 1 packet by mouth daily.     Marland Kitchen amoxicillin (AMOXIL) 875 MG tablet Take 1 tablet (875 mg total) by mouth 2 (two) times daily. 20 tablet 0   No facility-administered medications prior to visit.      EXAM:  BP 134/76 (BP Location: Right Arm, Patient Position: Sitting, Cuff Size: Normal)   Pulse 71   Temp 99.3 F (37.4 C) (Oral)   Wt 138 lb 3.2 oz (62.7 kg)   SpO2 97%   BMI 24.95 kg/m   Body mass index is 24.95 kg/m.  GENERAL: vitals reviewed and listed above, alert, oriented, appears well hydrated and in no acute distress HEENT: atraumatic, conjunctiva  clear, no obvious abnormalities on inspection of external nose and ears tm 1+ was  Nl lm OP : no lesion edema or exudate  But 2+ red   Some streaking No lesion NECK: no obvious masses on inspection palpation  Non tender to palpation  MS: moves all extremities without noticeable focal  abnormality PSYCH: pleasant and cooperative, no obvious depression or anxiety Lab Results  Component Value Date   WBC 7.9 06/06/2018   HGB 13.7 06/06/2018   HCT 40.7 06/06/2018   PLT 319.0 06/06/2018   GLUCOSE 100 (H) 06/06/2018   CHOL 191 06/06/2018   TRIG 154.0 (H) 06/06/2018   HDL 51.60 06/06/2018   LDLDIRECT 145.4 08/21/2012   LDLCALC 109 (H) 06/06/2018   ALT 18 06/06/2018   AST 16 06/06/2018   NA 139 06/06/2018   K 4.6 06/06/2018   CL 102 06/06/2018   CREATININE 0.77 06/06/2018   BUN 9 06/06/2018   CO2 31 06/06/2018   TSH 1.80 06/06/2018   HGBA1C 6.4 06/06/2018   BP Readings from Last 3 Encounters:  11/20/18 134/76  10/03/18 110/60  06/06/18 110/70  poct rs neg  Throat culture pending   ASSESSMENT AND PLAN:  Discussed the following assessment and plan:  Sore throat recurrent -  Plan: POCT rapid strep A, Culture, Group A Strep, Ambulatory referral to ENT  History of strep sore throat - Plan: POCT rapid strep A, Culture, Group A Strep, Ambulatory referral to ENT  Neck pain Very atypical for  recurrent strep but sending culture in case   ? If related to acid relfux or other   Patient concerned about thyroid ? Other  Sent for ent  hopefully this week  ( lives part time in [place]  Husband is a Theme park manager)  And leaving town  Friday jan 17 -Patient advised to return or notify health care team  if  new concerns arise.  Patient Instructions  Checking for strep again  But this may be more related to acid ?   Would use different antibiotic if strep positive .   Will do ent consult  And  Plan to go back on protonix if not able to see ent this week.       Standley Brooking. Mirtie Bastyr M.D.

## 2018-11-20 ENCOUNTER — Ambulatory Visit: Payer: BLUE CROSS/BLUE SHIELD | Admitting: Internal Medicine

## 2018-11-20 ENCOUNTER — Encounter: Payer: Self-pay | Admitting: Internal Medicine

## 2018-11-20 VITALS — BP 134/76 | HR 71 | Temp 99.3°F | Wt 138.2 lb

## 2018-11-20 DIAGNOSIS — Z8709 Personal history of other diseases of the respiratory system: Secondary | ICD-10-CM | POA: Diagnosis not present

## 2018-11-20 DIAGNOSIS — M542 Cervicalgia: Secondary | ICD-10-CM

## 2018-11-20 DIAGNOSIS — J029 Acute pharyngitis, unspecified: Secondary | ICD-10-CM

## 2018-11-20 LAB — POCT RAPID STREP A (OFFICE): Rapid Strep A Screen: NEGATIVE

## 2018-11-20 NOTE — Patient Instructions (Addendum)
Checking for strep again  But this may be more related to acid ?   Would use different antibiotic if strep positive .   Will do ent consult  And  Plan to go back on protonix if not able to see ent this week.

## 2018-11-21 DIAGNOSIS — K219 Gastro-esophageal reflux disease without esophagitis: Secondary | ICD-10-CM | POA: Diagnosis not present

## 2018-11-21 DIAGNOSIS — R1314 Dysphagia, pharyngoesophageal phase: Secondary | ICD-10-CM | POA: Diagnosis not present

## 2018-11-22 LAB — CULTURE, GROUP A STREP
MICRO NUMBER: 46618
SPECIMEN QUALITY:: ADEQUATE

## 2018-12-26 ENCOUNTER — Other Ambulatory Visit: Payer: Self-pay | Admitting: Family Medicine

## 2019-01-01 ENCOUNTER — Other Ambulatory Visit: Payer: Self-pay | Admitting: Internal Medicine

## 2019-01-08 ENCOUNTER — Ambulatory Visit (INDEPENDENT_AMBULATORY_CARE_PROVIDER_SITE_OTHER): Payer: BLUE CROSS/BLUE SHIELD | Admitting: *Deleted

## 2019-01-08 DIAGNOSIS — Z23 Encounter for immunization: Secondary | ICD-10-CM

## 2019-01-09 DIAGNOSIS — Z6825 Body mass index (BMI) 25.0-25.9, adult: Secondary | ICD-10-CM | POA: Diagnosis not present

## 2019-01-09 DIAGNOSIS — Z01419 Encounter for gynecological examination (general) (routine) without abnormal findings: Secondary | ICD-10-CM | POA: Diagnosis not present

## 2019-05-01 ENCOUNTER — Other Ambulatory Visit: Payer: Self-pay | Admitting: Internal Medicine

## 2019-05-01 DIAGNOSIS — R0789 Other chest pain: Secondary | ICD-10-CM

## 2019-05-01 DIAGNOSIS — I340 Nonrheumatic mitral (valve) insufficiency: Secondary | ICD-10-CM

## 2019-05-01 DIAGNOSIS — I341 Nonrheumatic mitral (valve) prolapse: Secondary | ICD-10-CM

## 2019-05-28 NOTE — Progress Notes (Signed)
Chief Complaint  Patient presents with  . clogged ears    both ears x 5 days, pressure, tried OTC wax softener    HPI: Amanda Patterson 63 y.o.   sda for acute problem   Hearing  ddecrease this week  Feels like  When got wax in ears in past  Used drops over the weekend  For the past 3 days .  No fever  [place] can get to ears pop but not now .   No fever   Hx of dec  With wax in ears  No q tips no pain  No fever cough etc .   bp ahd been in range  ROS: See pertinent positives and negatives per HPI.  Past Medical History:  Diagnosis Date  . BP (high blood pressure)   . Chicken pox   . Headache(784.0)   . High cholesterol   . Hx of varicella   . Murmur, heart    MVP by hx  stress echo 2012 midl post prolapse valve  . Pain in the chest 10/22/2014   poss gi related  uncertain has risk factors  cont protonix cards check rov in 1-2 months     Family History  Problem Relation Age of Onset  . Diabetes Mother   . Hypertension Mother   . Stroke Father   . Hypertension Father   . Heart disease Paternal Grandmother   . Stroke Brother   . Heart attack Neg Hx   . Breast cancer Neg Hx     Social History   Socioeconomic History  . Marital status: Married    Spouse name: Not on file  . Number of children: Not on file  . Years of education: Not on file  . Highest education level: Not on file  Occupational History  . Not on file  Social Needs  . Financial resource strain: Not on file  . Food insecurity    Worry: Not on file    Inability: Not on file  . Transportation needs    Medical: Not on file    Non-medical: Not on file  Tobacco Use  . Smoking status: Never Smoker  . Smokeless tobacco: Never Used  Substance and Sexual Activity  . Alcohol use: No  . Drug use: No  . Sexual activity: Not on file  Lifestyle  . Physical activity    Days per week: Not on file    Minutes per session: Not on file  . Stress: Not on file  Relationships  . Social Clinical research associate on phone: Not on file    Gets together: Not on file    Attends religious service: Not on file    Active member of club or organization: Not on file    Attends meetings of clubs or organizations: Not on file    Relationship status: Not on file  Other Topics Concern  . 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    Walking 5 day per week.    Now  [place] for a few years still has residence in [place]  Outpatient Medications Prior to Visit  Medication Sig Dispense Refill  . amLODipine (NORVASC) 5 MG tablet TAKE 1 TABLET BY MOUTH EVERY DAY 90 tablet 0  . Ascorbic Acid (VITAMIN C) 1000 MG tablet Take 1,000 mg by mouth daily.    . Cholecalciferol (VITAMIN D3) 2000 UNITS TABS Take 1 tablet by mouth daily.    . nadolol (CORGARD) 20 MG tablet TAKE 1/2 TABLET BY MOUTH EVERY DAY 45 tablet 0  . nadolol (CORGARD) 20 MG tablet TAKE 1/2 TABLET BY MOUTH DAILY 45 tablet 0  . pantoprazole (PROTONIX) 40 MG tablet TAKE 1 TABLET BY MOUTH EVERY DAY 90 tablet 1  . Psyllium (METAMUCIL PO) Take 1 packet by mouth daily.      No facility-administered medications prior to visit.      EXAM:  BP 138/76 (BP Location: Right Arm, Patient Position: Sitting, Cuff Size: Normal)   Pulse 85   Temp (!) 97.4 F (36.3 C) (Oral)   Wt 140 lb 6.4 oz (63.7 kg)   SpO2 98%   BMI 25.35 kg/m   Body mass index is 25.35 kg/m.  GENERAL: vitals reviewed and listed above, alert, oriented, appears well hydrated and in no acute distress no congested   HEENT: atraumatic, conjunctiva  clear, no obvious abnormalities on inspection of external nose and ears left eac 1-2+ wax  But not looking impacted   Brown  Moist    righ ear  1 + wax  Noted  Full tim not seen   After  irrigation left tm clear   Right  curret flaky subs  Inferior  Fluid in tm   Bony lm nl  NECK: no obvious masses on inspection palpation  LUNGS: clear to auscultation bilaterally, no wheezes, rales or rhonchi, good air movement PSYCH: pleasant and cooperative, no obvious depression or anxiety  ASSESSMENT AND PLAN:  Discussed the following assessment and plan:   ICD-10-CM   1. Decreased hearing of both ears  H91.93    improved after ear irrigation  2. Wax in ear  H61.20   3. Dysfunction of Eustachian tube, unspecified laterality  H69.80   Improvement in hearing after irrigation but may have some  Serous fluid right tm  Can use sudafed and flonase if needed and time   No alarm features if  On going  Get back with [place] consider getting  More eval     Hx of same ome eustachaian tube dysfunction also      -Patient advised to return or notify health care team  if symptoms worsen ,persist or new concerns arise.  Patient Instructions     ok to use ocass softening drops if needed  But may not be enough .     Earwax Buildup, Adult The ears produce a substance called earwax that helps keep bacteria out of the ear and protects the skin in the ear canal. Occasionally, earwax can build up in the ear and cause discomfort or hearing loss. What increases the risk? This condition is more likely to develop in people who:  Are female.  Are elderly.  Naturally produce more earwax.  Clean their ears often with cotton swabs.  Use earplugs often.  Use in-ear headphones often.  Wear hearing aids.  Have narrow ear canals.  Have earwax that is overly thick or sticky.  Have eczema.  Are dehydrated.  Have excess hair in the ear canal. What are the signs or symptoms? Symptoms of this condition include:  Reduced or muffled hearing.  A  feeling of fullness in the ear or feeling that the ear is plugged.  Fluid coming from the ear.  Ear pain.  Ear itch.  Ringing in the ear.  Coughing.  An  obvious piece of earwax that can be seen inside the ear canal. How is this diagnosed? This condition may be diagnosed based on:  Your symptoms.  Your medical history.  An ear exam. During the exam, your health care provider will look into your ear with an instrument called an otoscope. You may have tests, including a hearing test. How is this treated? This condition may be treated by:  Using ear drops to soften the earwax.  Having the earwax removed by a health care provider. The health care provider may: ? Flush the ear with water. ? Use an instrument that has a loop on the end (curette). ? Use a suction device.  Surgery to remove the wax buildup. This may be done in severe cases. Follow these instructions at home:   Take over-the-counter and prescription medicines only as told by your health care provider.  Do not put any objects, including cotton swabs, into your ear. You can clean the opening of your ear canal with a washcloth or facial tissue.  Follow instructions from your health care provider about cleaning your ears. Do not over-clean your ears.  Drink enough fluid to keep your urine clear or pale yellow. This will help to thin the earwax.  Keep all follow-up visits as told by your health care provider. If earwax builds up in your ears often or if you use hearing aids, consider seeing your health care provider for routine, preventive ear cleanings. Ask your health care provider how often you should schedule your cleanings.  If you have hearing aids, clean them according to instructions from the manufacturer and your health care provider. Contact a health care provider if:  You have ear pain.  You develop a fever.  You have blood, pus, or other fluid coming from your ear.  You have hearing loss.  You have ringing in your ears that does not go away.  Your symptoms do not improve with treatment.  You feel like the room is spinning (vertigo). Summary  Earwax  can build up in the ear and cause discomfort or hearing loss.  The most common symptoms of this condition include reduced or muffled hearing and a feeling of fullness in the ear or feeling that the ear is plugged.  This condition may be diagnosed based on your symptoms, your medical history, and an ear exam.  This condition may be treated by using ear drops to soften the earwax or by having the earwax removed by a health care provider.  Do not put any objects, including cotton swabs, into your ear. You can clean the opening of your ear canal with a washcloth or facial tissue. This information is not intended to replace advice given to you by your health care provider. Make sure you discuss any questions you have with your health care provider. Document Released: 12/02/2004 Document Revised: 10/07/2017 Document Reviewed: 01/05/2017 Elsevier Patient Education  2020 Creighton Panosh M.D.

## 2019-05-29 ENCOUNTER — Other Ambulatory Visit: Payer: Self-pay

## 2019-05-29 ENCOUNTER — Encounter: Payer: Self-pay | Admitting: Internal Medicine

## 2019-05-29 ENCOUNTER — Ambulatory Visit: Payer: BC Managed Care – PPO | Admitting: Internal Medicine

## 2019-05-29 VITALS — BP 138/76 | HR 85 | Temp 97.4°F | Wt 140.4 lb

## 2019-05-29 DIAGNOSIS — H9193 Unspecified hearing loss, bilateral: Secondary | ICD-10-CM

## 2019-05-29 DIAGNOSIS — H6120 Impacted cerumen, unspecified ear: Secondary | ICD-10-CM | POA: Diagnosis not present

## 2019-05-29 DIAGNOSIS — H6980 Other specified disorders of Eustachian tube, unspecified ear: Secondary | ICD-10-CM | POA: Diagnosis not present

## 2019-05-29 NOTE — Patient Instructions (Signed)
ok to use ocass softening drops if needed  But may not be enough .     Earwax Buildup, Adult The ears produce a substance called earwax that helps keep bacteria out of the ear and protects the skin in the ear canal. Occasionally, earwax can build up in the ear and cause discomfort or hearing loss. What increases the risk? This condition is more likely to develop in people who:  Are female.  Are elderly.  Naturally produce more earwax.  Clean their ears often with cotton swabs.  Use earplugs often.  Use in-ear headphones often.  Wear hearing aids.  Have narrow ear canals.  Have earwax that is overly thick or sticky.  Have eczema.  Are dehydrated.  Have excess hair in the ear canal. What are the signs or symptoms? Symptoms of this condition include:  Reduced or muffled hearing.  A feeling of fullness in the ear or feeling that the ear is plugged.  Fluid coming from the ear.  Ear pain.  Ear itch.  Ringing in the ear.  Coughing.  An obvious piece of earwax that can be seen inside the ear canal. How is this diagnosed? This condition may be diagnosed based on:  Your symptoms.  Your medical history.  An ear exam. During the exam, your health care provider will look into your ear with an instrument called an otoscope. You may have tests, including a hearing test. How is this treated? This condition may be treated by:  Using ear drops to soften the earwax.  Having the earwax removed by a health care provider. The health care provider may: ? Flush the ear with water. ? Use an instrument that has a loop on the end (curette). ? Use a suction device.  Surgery to remove the wax buildup. This may be done in severe cases. Follow these instructions at home:   Take over-the-counter and prescription medicines only as told by your health care provider.  Do not put any objects, including cotton swabs, into your ear. You can clean the opening of your ear canal  with a washcloth or facial tissue.  Follow instructions from your health care provider about cleaning your ears. Do not over-clean your ears.  Drink enough fluid to keep your urine clear or pale yellow. This will help to thin the earwax.  Keep all follow-up visits as told by your health care provider. If earwax builds up in your ears often or if you use hearing aids, consider seeing your health care provider for routine, preventive ear cleanings. Ask your health care provider how often you should schedule your cleanings.  If you have hearing aids, clean them according to instructions from the manufacturer and your health care provider. Contact a health care provider if:  You have ear pain.  You develop a fever.  You have blood, pus, or other fluid coming from your ear.  You have hearing loss.  You have ringing in your ears that does not go away.  Your symptoms do not improve with treatment.  You feel like the room is spinning (vertigo). Summary  Earwax can build up in the ear and cause discomfort or hearing loss.  The most common symptoms of this condition include reduced or muffled hearing and a feeling of fullness in the ear or feeling that the ear is plugged.  This condition may be diagnosed based on your symptoms, your medical history, and an ear exam.  This condition may be treated by using ear  drops to soften the earwax or by having the earwax removed by a health care provider.  Do not put any objects, including cotton swabs, into your ear. You can clean the opening of your ear canal with a washcloth or facial tissue. This information is not intended to replace advice given to you by your health care provider. Make sure you discuss any questions you have with your health care provider. Document Released: 12/02/2004 Document Revised: 10/07/2017 Document Reviewed: 01/05/2017 Elsevier Patient Education  2020 [employer].

## 2019-06-21 ENCOUNTER — Telehealth: Payer: Self-pay | Admitting: Internal Medicine

## 2019-06-21 ENCOUNTER — Emergency Department (HOSPITAL_COMMUNITY)
Admission: EM | Admit: 2019-06-21 | Discharge: 2019-06-21 | Disposition: A | Discharge status: Home / Self Care | Payer: BC Managed Care – PPO | Attending: Emergency Medicine | Admitting: Emergency Medicine

## 2019-06-21 ENCOUNTER — Other Ambulatory Visit: Payer: Self-pay | Admitting: Internal Medicine

## 2019-06-21 ENCOUNTER — Other Ambulatory Visit: Payer: Self-pay

## 2019-06-21 ENCOUNTER — Ambulatory Visit: Payer: Self-pay

## 2019-06-21 DIAGNOSIS — I10 Essential (primary) hypertension: Secondary | ICD-10-CM | POA: Diagnosis not present

## 2019-06-21 DIAGNOSIS — M79631 Pain in right forearm: Secondary | ICD-10-CM | POA: Diagnosis not present

## 2019-06-21 DIAGNOSIS — R200 Anesthesia of skin: Secondary | ICD-10-CM | POA: Insufficient documentation

## 2019-06-21 DIAGNOSIS — M79601 Pain in right arm: Secondary | ICD-10-CM | POA: Diagnosis not present

## 2019-06-21 DIAGNOSIS — Z79899 Other long term (current) drug therapy: Secondary | ICD-10-CM | POA: Insufficient documentation

## 2019-06-21 NOTE — Telephone Encounter (Signed)
Spoke with patient and she is complaining of right arm and wrist pain.  Patient also stated that she has a headache with neck pain for about a day.  Advised patient to go to Urgent Care because Dr Regis Bill is out of the office and the doctors are full for the day.  Patient verbally agreed.

## 2019-06-21 NOTE — ED Notes (Signed)
Pt has 2+ right radial pulse, 5/5 grip strength, cap refill less than 3 sec, sensation intact. Pt denies numbness and tingling of right arm at present.

## 2019-06-21 NOTE — Discharge Instructions (Signed)
If your pain returns or you have additional complaints or concerns please seek additional medical care and evaluation.

## 2019-06-21 NOTE — ED Triage Notes (Signed)
Per pt she was making the bed today and felt like her right arm twisted and now is hurting, no obvious swelling. Pt said when she turns it a certain way hurting.

## 2019-06-21 NOTE — Telephone Encounter (Signed)
Patient has arrived at ED.

## 2019-06-21 NOTE — ED Provider Notes (Signed)
[facility] EMERGENCY DEPARTMENT Provider Note   CSN: [id number] Arrival date & time: 06/21/19  1830    History   Chief Complaint Chief Complaint  Patient presents with  . Arm Pain    HPI Amanda Patterson is a 63 y.o. female here for evaluation of left wrist pain.  She reports that earlier today she was making a bed and she had sudden onset of pain from her right forearm shooting down into her right hand.  She reports that it felt like she had pins-and-needles in addition.  It lasted under a minute and then went away.  She called her primary care doctor who recommended she come to the emergency room.  She did not try any interventions prior to arrival.  On chart review patient reports to her primary care doctor that she was having neck pain and headache.  Patient did not report these during my interview.  She reports that at this time her symptoms in her right hand and wrist have all fully resolved.    HPI  Past Medical History:  Diagnosis Date  . BP (high blood pressure)   . Chicken pox   . Headache(784.0)   . High cholesterol   . Hx of varicella   . Murmur, heart    MVP by hx  stress echo 2012 midl post prolapse valve  . Pain in the chest 10/22/2014   poss gi related  uncertain has risk factors  cont protonix cards check rov in 1-2 months     Patient Active Problem List   Diagnosis Date Noted  . Essential hypertension 10/22/2014  . Hyperglycemia 02/27/2013  . Visit for preventive health examination 02/27/2013  . Hypertension 08/21/2012  . MVP (mitral valve prolapse) 08/21/2012  . Hx gestational diabetes 08/21/2012  . Headache(784.0) 08/21/2012  . Need for prophylactic vaccination and inoculation against influenza 08/21/2012  . Neck pain 08/21/2012  . Other and unspecified hyperlipidemia 08/21/2012    Past Surgical History:  Procedure Laterality Date  . BIOPSY BREAST    . BREAST EXCISIONAL BIOPSY Bilateral    Approx. 15 years ago  . CESAREAN  SECTION  0865,78,46     OB History   No obstetric history on file.      Home Medications    Prior to Admission medications   Medication Sig Start Date End Date Taking? Authorizing Provider  amLODipine (NORVASC) 5 MG tablet TAKE 1 TABLET BY MOUTH EVERY DAY 05/01/19   Panosh, Standley Brooking, MD  Ascorbic Acid (VITAMIN C) 1000 MG tablet Take 1,000 mg by mouth daily.    [provider]  Cholecalciferol (VITAMIN D3) 2000 UNITS TABS Take 1 tablet by mouth daily.    [provider]  nadolol (CORGARD) 20 MG tablet TAKE 1/2 TABLET BY MOUTH EVERY DAY 10/13/18   Panosh, Standley Brooking, MD  nadolol (CORGARD) 20 MG tablet TAKE 1/2 TABLET BY MOUTH DAILY 01/01/19   Panosh, Standley Brooking, MD  nadolol (CORGARD) 20 MG tablet TAKE 1/2 TABLET BY MOUTH EVERY DAY 06/21/19   Panosh, Standley Brooking, MD  pantoprazole (PROTONIX) 40 MG tablet TAKE 1 TABLET BY MOUTH EVERY DAY 12/26/18   Panosh, Standley Brooking, MD  Psyllium (METAMUCIL PO) Take 1 packet by mouth daily.     [provider]    Family History Family History  Problem Relation Age of Onset  . Diabetes Mother   . Hypertension Mother   . Stroke Father   . Hypertension Father   . Heart disease Paternal  Grandmother   . Stroke Brother   . Heart attack Neg Hx   . Breast cancer Neg Hx     Social History Social History   Tobacco Use  . Smoking status: Never Smoker  . Smokeless tobacco: Never Used  Substance Use Topics  . Alcohol use: No  . Drug use: No     Allergies   Patient has no known allergies.   Review of Systems Review of Systems  Constitutional: Negative for chills and fever.  Musculoskeletal: Negative for back pain and neck pain.  Neurological: Negative for weakness and headaches.       Resolved tingling in right hand  All other systems reviewed and are negative.    Physical Exam Updated Vital Signs BP 136/84   Pulse 71   Temp 98.3 F (36.8 C) (Oral)   Resp 18   SpO2 99%   Physical Exam Vitals signs and nursing note  reviewed.  Constitutional:      General: She is not in acute distress.    Appearance: She is not ill-appearing.  HENT:     Head: Normocephalic.  Neck:     Musculoskeletal: Normal range of motion and neck supple.  Cardiovascular:     Rate and Rhythm: Normal rate.     Comments: 2+ right radial pulse.  Fingers on right hand are warm with brisk capillary refill. Pulmonary:     Effort: Pulmonary effort is normal. No respiratory distress.  Musculoskeletal:     Comments: Right forearm compartments are soft and easily compressible.  She has 5/5 strength through elbow flexion/extension, right arm pronation/supination, right wrist flexion/extension and grip strength.  None of these elicit any pain in the right arm.  There is no localized tenderness to palpation crepitus or deformities palpated in the right arm.  Neurological:     Mental Status: She is alert.     Comments: Sensation intact to light touch to right hand and arm.      ED Treatments / Results  Labs (all labs ordered are listed, but only abnormal results are displayed) Labs Reviewed - No data to display  EKG None  Radiology No results found.  Procedures Procedures (including critical care time)  Medications Ordered in ED Medications - No data to display   Initial Impression / Assessment and Plan / ED Course  I have reviewed the triage vital signs and the nursing notes.  Pertinent labs & imaging results that were available during my care of the patient were reviewed by me and considered in my medical decision making (see chart for details).       Patient presents today for evaluation of brief right forearm/hand pain that is fully resolved.  On exam she does not have any symptoms or complaints at this time.  Her pain had fully resolved, she is neurovascularly intact in the right arm.  As she did not have any trauma and her symptoms have all resolved radiology is not indicated.  While chart review shows that she  reported headache and neck pain to her primary care doctor she did not complain of these to me during evaluation.  Return precautions were discussed with patient who states their understanding.  At the time of discharge patient denied any unaddressed complaints or concerns.  Patient is agreeable for discharge home.    Final Clinical Impressions(s) / ED Diagnoses   Final diagnoses:  Right arm pain    ED Discharge Orders    None  Ollen Gross 06/21/19 2327    Quintella Reichert, MD 06/22/19 0100

## 2019-06-21 NOTE — Telephone Encounter (Signed)
Copied from [place] [id number]. Topic: General - Other >> Jun 21, 2019  4:05 PM Leward Quan A wrote: Reason for CRM: Patient called to say that she was in the process of folding clothes when she may have pulled something in her right arm and then she started feeling numbness in her fingers. She is concerned and would like to discuss what possibly happened. Ph# [phone]

## 2019-06-21 NOTE — Telephone Encounter (Signed)
Patient called stating that today at about 2:30 she was making her bed pulling on sheets and  She hada sharp pain that started in her wrist and went up her arm.  She states that now her fingers are numb.  She states she has had some problems with her neck as well before this event. She states that she can move her wrist side to side with some pain. There is no swelling or bruising. She is able to make a fist. Her fingers are numb. Patient also states she has a headache with sinus pressure. Per protocol patient will go to ER for evaluation of wrist injury. Note will be routed to Dr Regis Bill for review.  Reason for Disposition . [1] Numbness (loss of sensation) of finger(s) AND [2] present now  Answer Assessment - Initial Assessment Questions 1. MECHANISM: "How did the injury happen?"    Placing sheets on bed 2. ONSET: "When did the injury happen?" (Minutes or hours ago)      2 this afternoon 3. LOCATION: "Where is the injury located?"     Wrist right 4. APPEARANCE of INJURY: "What does the injury look like?"      no 5. SEVERITY: "Can you use the arm normally?"      Yes with some pain when turning it 6. SWELLING or BRUISING: "is there any swelling or bruising?" If so, ask: "How large is it? (e.g., inches, centimeters)     no 7. PAIN: "Is there pain?" If so, ask: "How bad is the pain?"    (Scale 1-10; or mild, moderate, severe)     4 8. TETANUS: For any breaks in the skin, ask: "When was the last tetanus booster?"     N/A 9. OTHER SYMPTOMS: "Do you have any other symptoms?"  (e.g., numbness in hand)     Yes numb hand 10. PREGNANCY: "Is there any chance you are pregnant?" "When was your last menstrual period?"       N/A  Answer Assessment - Initial Assessment Questions 1. MECHANISM: "How did the injury happen?"    Making bed 2. ONSET: "When did the injury happen?" (Minutes or hours ago)      today 3. APPEARANCE of INJURY: "What does the injury look like?"     None 4. SEVERITY: "Can you  use the hand normally?" "Can you bend your fingers into a ball and then fully open them?"   No problem 5. SIZE: For cuts, bruises, or swelling, ask: "How large is it?" (e.g., inches or centimeters;  entire hand or wrist)     N/A 6. PAIN: "Is there pain?" If so, ask: "How bad is the pain?"  (Scale 1-10; or mild, moderate, severe)    4 7. TETANUS: For any breaks in the skin, ask: "When was the last tetanus booster?"    n/a 8. OTHER SYMPTOMS: "Do you have any other symptoms?"     numbnss to hand 9. PREGNANCY: "Is there any chance you are pregnant?" "When was your last menstrual period?"     N/A  Protocols used: HAND AND WRIST [place], ARM INJURY-A-AH

## 2019-06-22 NOTE — Telephone Encounter (Signed)
Pt went to ED

## 2019-07-23 NOTE — Progress Notes (Signed)
Cardiology Office Note:    Date:  07/24/2019   ID:  Amanda Patterson, DOB 1955-12-06, MRN [id number]  PCP:  Burnis Medin, MD  Cardiologist:  No primary care provider on file.  Electrophysiologist:  None   Referring MD: Burnis Medin, MD   Reason for visit: 2 year follow up  History of Present Illness:    Amanda Patterson is a 63 y.o. female with a hx ofhypertension and mitral valve prolapse who was previously evaluated for chest pain. A stress echocardiogram performed in 2012 that showed excellent exercise capacity, no ischemia, mild prolapse of the posterior mitral valve leaflet and only trace mitral and tricuspid regurgitation. Her left atrial size was normal. She walks daily about 2 miles and feels no shortness of breath or chest pain. There is no family history of premature coronary artery disease.  11/18/2016 - the patient is coming after one year she states that she has felt great she continues to travel with her husband for [place] work. She is no palpitations or syncope. She's noticed lower extremity edema only on long flights. Her chest pain has resolved since she was started on Protonix. She remains active and doesn't have exertional chest pain.  07/24/2019 - the patient is coming after two years, she has been doing great, currently not traveling for [place] work because of [place].  She is trying to walk daily and denies any chest pain shortness of breath.  She has no lower extremity edema orthopnea or proximal nocturnal dyspnea.  Denies any palpitations.   Past Medical History:  Diagnosis Date  . BP (high blood pressure)   . Chicken pox   . Headache(784.0)   . High cholesterol   . Hx of varicella   . Murmur, heart    MVP by hx  stress echo 2012 midl post prolapse valve  . Pain in the chest 10/22/2014   poss gi related  uncertain has risk factors  cont protonix cards check rov in 1-2 months     Past Surgical History:  Procedure Laterality Date  . BIOPSY BREAST    .  BREAST EXCISIONAL BIOPSY Bilateral    Approx. 15 years ago  . CESAREAN SECTION  [phone]    Current Medications: Current Meds  Medication Sig  . amLODipine (NORVASC) 5 MG tablet TAKE 1 TABLET BY MOUTH EVERY DAY  . Ascorbic Acid (VITAMIN C) 1000 MG tablet Take 1,000 mg by mouth daily.  . Cholecalciferol (VITAMIN D3) 2000 UNITS TABS Take 1 tablet by mouth daily.  . nadolol (CORGARD) 20 MG tablet TAKE 1/2 TABLET BY MOUTH EVERY DAY  . pantoprazole (PROTONIX) 40 MG tablet Take 40 mg by mouth as needed.  . Psyllium (METAMUCIL PO) Take 1 packet by mouth daily.      Allergies:   Patient has no known allergies.   Social History   Socioeconomic History  . Marital status: Married    Spouse name: Not on file  . Number of children: Not on file  . Years of education: Not on file  . Highest education level: Not on file  Occupational History  . Not on file  Social Needs  . Financial resource strain: Not on file  . Food insecurity    Worry: Not on file    Inability: Not on file  . Transportation needs    Medical: Not on file    Non-medical: Not on file  Tobacco Use  . Smoking status: Never Smoker  . Smokeless tobacco: Never Used  Substance  and Sexual Activity  . Alcohol use: No  . Drug use: No  . Sexual activity: Not on file  Lifestyle  . Physical activity    Days per week: Not on file    Minutes per session: Not on file  . Stress: Not on file  Relationships  . Social Herbalist on phone: Not on file    Gets together: Not on file    Attends religious service: Not on file    Active member of club or organization: Not on file    Attends meetings of clubs or organizations: Not on file    Relationship status: Not on file  Other Topics Concern  . 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    Walking 5 day per week.    Now  [place] for a few years still has residence in [place]              Family History: The patient's family history includes Diabetes in her mother; Heart disease in her paternal grandmother; Hypertension in her father and mother; Stroke in her brother and father. There is no history of Heart attack or Breast cancer.  ROS:   Please see the history of present illness.    All other systems reviewed and are negative.  EKGs/Labs/Other Studies Reviewed:    The following studies were reviewed today:  TTE: 12/2014   - Left ventricle: The cavity size was normal. Wall thickness was  normal. Systolic function was normal. The estimated ejection  fraction was in the range of 55% to 60%. Wall motion was normal;  there were no regional wall motion abnormalities. Features are  consistent with a pseudonormal left ventricular filling pattern,  with concomitant abnormal relaxation and increased filling  pressure (grade 2 diastolic dysfunction).  - Aortic valve: There was no stenosis.  - Mitral valve: There was mild regurgitation.  - Left atrium: The atrium was mildly dilated.  - Right ventricle: The cavity size was normal. Systolic function  was normal.  - Pulmonary arteries: No complete TR doppler jet so unable to  estimate PA systolic pressure.  - Inferior vena cava: The vessel was normal in size. The  respirophasic diameter changes were in the normal range (>= 50%),  consistent with normal central venous pressure.   EKG:  EKG is ordered today.  The ekg ordered today demonstrates normal sinus rhythm, normal EKG, unchanged from prior, this was personally reviewed.  Recent Labs: No results found for requested labs within last 8760 hours.  Recent Lipid Panel    Component Value Date/Time   CHOL 191 06/06/2018 1043   TRIG 154.0 (H) 06/06/2018 1043   HDL 51.60 06/06/2018 1043    CHOLHDL 4 06/06/2018 1043   VLDL 30.8 06/06/2018 1043   LDLCALC 109 (H) 06/06/2018 1043   LDLDIRECT 145.4 08/21/2012 1235    Physical Exam:    VS:  BP (!) 150/82   Pulse 90   Ht 5' 2"  (1.575 m)   Wt 138 lb (62.6 kg)   SpO2 94%   BMI 25.24 kg/m  Wt Readings from Last 3 Encounters:  07/24/19 138 lb (62.6 kg)  05/29/19 140 lb 6.4 oz (63.7 kg)  11/20/18 138 lb 3.2 oz (62.7 kg)     GEN: Well nourished, well developed in no acute distress HEENT: Normal NECK: No JVD; No carotid bruits LYMPHATICS: No lymphadenopathy CARDIAC: RRR, no murmurs, rubs, gallops RESPIRATORY:  Clear to auscultation without rales, wheezing or rhonchi  ABDOMEN: Soft, non-tender, non-distended MUSCULOSKELETAL:  No edema; No deformity  SKIN: Warm and dry NEUROLOGIC:  Alert and oriented x 3 PSYCHIATRIC:  Normal affect   ASSESSMENT:    1. MVP (mitral valve prolapse)   2. Essential hypertension   3. Need for prophylactic vaccination and inoculation against influenza   4. Visit for preventive health examination    PLAN:    In order of problems listed above:  1. Atypical chest pain - resolved after starting Protonix.  She continues to have completely normal EKG, no ischemic work-up is needed.  She is encouraged to continue walking.  2. Mitral valve prolapse - mild prolapse of the posterior leaflet of the mitral valve based on the report from 2012 associated with only trivial mitral regurgitation. Repeated echocardiogram in 2016 showed normal LVEF and only mild mitral regurgitation.  Her murmur appears slightly worse, will repeat echo now.    3. Hypertension - elevated today, repeated 141/85, she states is normal at home, she is educated about low-sodium diet, for now we will continue the same management..  4. Hyperlipidemia -last year borderline LDL and triglycerides, will repeat now.  Medication Adjustments/Labs and Tests Ordered: Current medicines are reviewed at length with the patient today.   Concerns regarding medicines are outlined above.  Orders Placed This Encounter  Procedures  . CBC  . Lipid Profile  . Comp Met (CMET)  . TSH  . EKG 12-Lead  . ECHOCARDIOGRAM COMPLETE   No orders of the defined types were placed in this encounter.   Patient Instructions  Medication Instructions:  Your physician recommends that you continue on your current medications as directed. Please refer to the Current Medication list given to you today.  If you need a refill on your cardiac medications before your next appointment, please call your pharmacy.   Lab work: SAME DAY AS THE ECHO.... CBC, LIPIDS, CMP, TSH....FASTING  If you have labs (blood work) drawn today and your tests are completely normal, you will receive your results only by: [place] Kitchen MyChart Message (if you have MyChart) OR . A paper copy in the mail If you have any lab test that is abnormal or we need to change your treatment, we will call you to review the results.  Testing/Procedures: (EARLY SO SHE CAN HAVE FASTIN LABS SAME DAY)  Your physician has requested that you have an echocardiogram. Echocardiography is a painless test that uses sound waves to create images of your heart. It provides your doctor with information about the size and shape of your heart and how well your heart's chambers and valves are working. This procedure takes approximately one hour. There are no restrictions for this procedure.    Follow-Up: At [facility], you and your health needs are our priority.  As part of our continuing mission to provide you with exceptional heart care, we have created designated Provider Care Teams.  These Care Teams include your primary Cardiologist (physician) and Advanced Practice Providers (APPs -  Physician Assistants and Nurse Practitioners) who all work together to provide you with the care you need, when you need  it. You will need a follow up appointment in 1 year.  Please call our office 2 months in advance to  schedule this appointment.  You may see Dr. Meda Coffee or one of the following Advanced Practice Providers on your designated Care Team:   [place], PA-C Melina Copa, PA-C . Ermalinda Barrios, PA-C  Any Other Special Instructions Will Be Listed Below (If Applicable).       Signed, Ena Dawley, MD  07/24/2019 10:49 AM    [place]

## 2019-07-24 ENCOUNTER — Other Ambulatory Visit: Payer: Self-pay

## 2019-07-24 ENCOUNTER — Encounter: Payer: Self-pay | Admitting: Cardiology

## 2019-07-24 ENCOUNTER — Ambulatory Visit (INDEPENDENT_AMBULATORY_CARE_PROVIDER_SITE_OTHER): Payer: BC Managed Care – PPO | Admitting: Cardiology

## 2019-07-24 VITALS — BP 150/82 | HR 90 | Ht 62.0 in | Wt 138.0 lb

## 2019-07-24 DIAGNOSIS — I341 Nonrheumatic mitral (valve) prolapse: Secondary | ICD-10-CM

## 2019-07-24 DIAGNOSIS — Z23 Encounter for immunization: Secondary | ICD-10-CM | POA: Diagnosis not present

## 2019-07-24 DIAGNOSIS — I10 Essential (primary) hypertension: Secondary | ICD-10-CM | POA: Diagnosis not present

## 2019-07-24 DIAGNOSIS — Z0000 Encounter for general adult medical examination without abnormal findings: Secondary | ICD-10-CM

## 2019-07-24 NOTE — Progress Notes (Unsigned)
echo

## 2019-07-24 NOTE — Patient Instructions (Signed)
Medication Instructions:  Your physician recommends that you continue on your current medications as directed. Please refer to the Current Medication list given to you today.  If you need a refill on your cardiac medications before your next appointment, please call your pharmacy.   Lab work: SAME DAY AS THE ECHO.... CBC, LIPIDS, CMP, TSH....FASTING  If you have labs (blood work) drawn today and your tests are completely normal, you will receive your results only by: [place] Kitchen MyChart Message (if you have MyChart) OR . A paper copy in the mail If you have any lab test that is abnormal or we need to change your treatment, we will call you to review the results.  Testing/Procedures: (EARLY SO SHE CAN HAVE FASTIN LABS SAME DAY)  Your physician has requested that you have an echocardiogram. Echocardiography is a painless test that uses sound waves to create images of your heart. It provides your doctor with information about the size and shape of your heart and how well your heart's chambers and valves are working. This procedure takes approximately one hour. There are no restrictions for this procedure.    Follow-Up: At [facility], you and your health needs are our priority.  As part of our continuing mission to provide you with exceptional heart care, we have created designated Provider Care Teams.  These Care Teams include your primary Cardiologist (physician) and Advanced Practice Providers (APPs -  Physician Assistants and Nurse Practitioners) who all work together to provide you with the care you need, when you need it. You will need a follow up appointment in 1 year.  Please call our office 2 months in advance to schedule this appointment.  You may see Dr. Meda Coffee or one of the following Advanced Practice Providers on your designated Care Team:   [place], PA-C Melina Copa, PA-C . Ermalinda Barrios, PA-C  Any Other Special Instructions Will Be Listed Below (If Applicable).

## 2019-07-26 ENCOUNTER — Other Ambulatory Visit: Payer: Self-pay | Admitting: Internal Medicine

## 2019-07-26 DIAGNOSIS — I341 Nonrheumatic mitral (valve) prolapse: Secondary | ICD-10-CM

## 2019-07-26 DIAGNOSIS — R0789 Other chest pain: Secondary | ICD-10-CM

## 2019-07-26 DIAGNOSIS — I340 Nonrheumatic mitral (valve) insufficiency: Secondary | ICD-10-CM

## 2019-07-31 ENCOUNTER — Other Ambulatory Visit: Payer: Self-pay

## 2019-07-31 ENCOUNTER — Ambulatory Visit (HOSPITAL_COMMUNITY): Payer: BC Managed Care – PPO | Attending: Cardiology

## 2019-07-31 ENCOUNTER — Other Ambulatory Visit: Payer: BC Managed Care – PPO

## 2019-07-31 DIAGNOSIS — Z0000 Encounter for general adult medical examination without abnormal findings: Secondary | ICD-10-CM

## 2019-07-31 DIAGNOSIS — I341 Nonrheumatic mitral (valve) prolapse: Secondary | ICD-10-CM | POA: Diagnosis not present

## 2019-07-31 DIAGNOSIS — I10 Essential (primary) hypertension: Secondary | ICD-10-CM | POA: Insufficient documentation

## 2019-07-31 DIAGNOSIS — Z23 Encounter for immunization: Secondary | ICD-10-CM | POA: Diagnosis not present

## 2019-07-31 LAB — COMPREHENSIVE METABOLIC PANEL
ALT: 24 IU/L (ref 0–32)
AST: 25 IU/L (ref 0–40)
Albumin/Globulin Ratio: 1.6 (ref 1.2–2.2)
Albumin: 4.5 g/dL (ref 3.8–4.8)
Alkaline Phosphatase: 67 IU/L (ref 39–117)
BUN/Creatinine Ratio: 13 (ref 12–28)
BUN: 10 mg/dL (ref 8–27)
Bilirubin Total: 0.2 mg/dL (ref 0.0–1.2)
CO2: 24 mmol/L (ref 20–29)
Calcium: 9.8 mg/dL (ref 8.7–10.3)
Chloride: 103 mmol/L (ref 96–106)
Creatinine, Ser: 0.78 mg/dL (ref 0.57–1.00)
GFR calc Af Amer: 95 mL/min/{1.73_m2} (ref >59)
GFR calc non Af Amer: 82 mL/min/{1.73_m2} (ref >59)
Globulin, Total: 2.9 g/dL (ref 1.5–4.5)
Glucose: 110 mg/dL — ABNORMAL HIGH (ref 65–99)
Potassium: 4.8 mmol/L (ref 3.5–5.2)
Sodium: 142 mmol/L (ref 134–144)
Total Protein: 7.4 g/dL (ref 6.0–8.5)

## 2019-07-31 LAB — LIPID PANEL
Chol/HDL Ratio: 3.6 ratio (ref 0.0–4.4)
Cholesterol, Total: 183 mg/dL (ref 100–199)
HDL: 51 mg/dL (ref >39)
LDL Chol Calc (NIH): 113 mg/dL — ABNORMAL HIGH (ref 0–99)
Triglycerides: 104 mg/dL (ref 0–149)
VLDL Cholesterol Cal: 19 mg/dL (ref 5–40)

## 2019-07-31 LAB — CBC
Hematocrit: 44.7 % (ref 34.0–46.6)
Hemoglobin: 15.1 g/dL (ref 11.1–15.9)
MCH: 29.1 pg (ref 26.6–33.0)
MCHC: 33.8 g/dL (ref 31.5–35.7)
MCV: 86 fL (ref 79–97)
Platelets: 345 10*3/uL (ref 150–450)
RBC: 5.19 x10E6/uL (ref 3.77–5.28)
RDW: 14.3 % (ref 11.7–15.4)
WBC: 8.8 10*3/uL (ref 3.4–10.8)

## 2019-07-31 LAB — TSH: TSH: 2.53 u[IU]/mL (ref 0.450–4.500)

## 2019-08-18 ENCOUNTER — Ambulatory Visit (INDEPENDENT_AMBULATORY_CARE_PROVIDER_SITE_OTHER): Payer: BC Managed Care – PPO

## 2019-08-18 ENCOUNTER — Other Ambulatory Visit: Payer: Self-pay

## 2019-08-18 DIAGNOSIS — Z23 Encounter for immunization: Secondary | ICD-10-CM

## 2019-09-07 DIAGNOSIS — H40053 Ocular hypertension, bilateral: Secondary | ICD-10-CM | POA: Diagnosis not present

## 2019-09-13 ENCOUNTER — Other Ambulatory Visit: Payer: Self-pay | Admitting: Internal Medicine

## 2019-09-22 ENCOUNTER — Other Ambulatory Visit: Payer: Self-pay | Admitting: Internal Medicine

## 2019-09-22 DIAGNOSIS — I341 Nonrheumatic mitral (valve) prolapse: Secondary | ICD-10-CM

## 2019-09-22 DIAGNOSIS — R0789 Other chest pain: Secondary | ICD-10-CM

## 2019-09-22 DIAGNOSIS — I340 Nonrheumatic mitral (valve) insufficiency: Secondary | ICD-10-CM

## 2019-10-17 ENCOUNTER — Other Ambulatory Visit: Payer: Self-pay | Admitting: Internal Medicine

## 2019-10-17 DIAGNOSIS — Z1231 Encounter for screening mammogram for malignant neoplasm of breast: Secondary | ICD-10-CM

## 2019-11-20 ENCOUNTER — Telehealth: Payer: Self-pay | Admitting: *Deleted

## 2019-11-20 NOTE — Telephone Encounter (Signed)
Patient informed that depending on insurance, she may have it done either at the office or at the pharmacy.  Copied from [place] [phone]. Topic: General - Other >> Nov 20, 2019  9:19 AM Sheran Luz wrote: Patient would like to know if she needs to come in for second shingles vaccine. She states she received first "a long time ago". Please advise.

## 2019-11-21 ENCOUNTER — Telehealth: Payer: Self-pay | Admitting: Internal Medicine

## 2019-11-21 ENCOUNTER — Encounter: Payer: Self-pay | Admitting: *Deleted

## 2019-11-21 NOTE — Telephone Encounter (Signed)
Patient called and would like a call back from a CMA because she wants to know when is she due for her 2nd shingle vaccine. She got her 1st in  2019.

## 2019-11-21 NOTE — Telephone Encounter (Signed)
Patient informed that it is okay for her to have vaccine done. Patient scheduled for 11/26/19.

## 2019-11-26 ENCOUNTER — Ambulatory Visit (INDEPENDENT_AMBULATORY_CARE_PROVIDER_SITE_OTHER): Payer: BC Managed Care – PPO

## 2019-11-26 ENCOUNTER — Other Ambulatory Visit: Payer: Self-pay

## 2019-11-26 DIAGNOSIS — Z23 Encounter for immunization: Secondary | ICD-10-CM

## 2019-12-07 ENCOUNTER — Ambulatory Visit
Admission: RE | Admit: 2019-12-07 | Discharge: 2019-12-07 | Disposition: A | Discharge status: Home / Self Care | Payer: BC Managed Care – PPO | Source: Ambulatory Visit | Attending: Internal Medicine | Admitting: Internal Medicine

## 2019-12-07 ENCOUNTER — Other Ambulatory Visit: Payer: Self-pay

## 2019-12-07 DIAGNOSIS — Z1231 Encounter for screening mammogram for malignant neoplasm of breast: Secondary | ICD-10-CM | POA: Diagnosis not present

## 2019-12-11 ENCOUNTER — Other Ambulatory Visit: Payer: Self-pay | Admitting: Internal Medicine

## 2019-12-11 DIAGNOSIS — R928 Other abnormal and inconclusive findings on diagnostic imaging of breast: Secondary | ICD-10-CM

## 2019-12-14 ENCOUNTER — Other Ambulatory Visit: Payer: BC Managed Care – PPO

## 2019-12-16 ENCOUNTER — Other Ambulatory Visit: Payer: Self-pay | Admitting: Internal Medicine

## 2019-12-19 DIAGNOSIS — Z23 Encounter for immunization: Secondary | ICD-10-CM | POA: Diagnosis not present

## 2019-12-22 ENCOUNTER — Other Ambulatory Visit: Payer: Self-pay | Admitting: Internal Medicine

## 2019-12-22 DIAGNOSIS — I341 Nonrheumatic mitral (valve) prolapse: Secondary | ICD-10-CM

## 2019-12-22 DIAGNOSIS — R0789 Other chest pain: Secondary | ICD-10-CM

## 2019-12-22 DIAGNOSIS — I340 Nonrheumatic mitral (valve) insufficiency: Secondary | ICD-10-CM

## 2019-12-25 ENCOUNTER — Ambulatory Visit
Admission: RE | Admit: 2019-12-25 | Discharge: 2019-12-25 | Disposition: A | Discharge status: Home / Self Care | Payer: BC Managed Care – PPO | Source: Ambulatory Visit | Attending: Internal Medicine | Admitting: Internal Medicine

## 2019-12-25 ENCOUNTER — Other Ambulatory Visit: Payer: Self-pay

## 2019-12-25 DIAGNOSIS — R928 Other abnormal and inconclusive findings on diagnostic imaging of breast: Secondary | ICD-10-CM

## 2019-12-25 DIAGNOSIS — N6002 Solitary cyst of left breast: Secondary | ICD-10-CM | POA: Diagnosis not present

## 2020-01-09 DIAGNOSIS — Z23 Encounter for immunization: Secondary | ICD-10-CM | POA: Diagnosis not present

## 2020-01-14 DIAGNOSIS — Z6824 Body mass index (BMI) 24.0-24.9, adult: Secondary | ICD-10-CM | POA: Diagnosis not present

## 2020-01-14 DIAGNOSIS — Z01419 Encounter for gynecological examination (general) (routine) without abnormal findings: Secondary | ICD-10-CM | POA: Diagnosis not present

## 2020-01-24 ENCOUNTER — Other Ambulatory Visit: Payer: Self-pay | Admitting: Internal Medicine

## 2020-01-25 ENCOUNTER — Other Ambulatory Visit: Payer: Self-pay

## 2020-01-25 ENCOUNTER — Encounter: Payer: Self-pay | Admitting: Family Medicine

## 2020-01-25 ENCOUNTER — Ambulatory Visit (INDEPENDENT_AMBULATORY_CARE_PROVIDER_SITE_OTHER): Payer: BC Managed Care – PPO | Admitting: Family Medicine

## 2020-01-25 VITALS — BP 140/84 | HR 68 | Temp 97.6°F | Ht 62.0 in | Wt 133.6 lb

## 2020-01-25 DIAGNOSIS — R079 Chest pain, unspecified: Secondary | ICD-10-CM | POA: Diagnosis not present

## 2020-01-25 DIAGNOSIS — M940 Chondrocostal junction syndrome [Tietze]: Secondary | ICD-10-CM

## 2020-01-25 DIAGNOSIS — Z13228 Encounter for screening for other metabolic disorders: Secondary | ICD-10-CM | POA: Diagnosis not present

## 2020-01-25 DIAGNOSIS — Z13220 Encounter for screening for lipoid disorders: Secondary | ICD-10-CM | POA: Diagnosis not present

## 2020-01-25 DIAGNOSIS — Z1329 Encounter for screening for other suspected endocrine disorder: Secondary | ICD-10-CM | POA: Diagnosis not present

## 2020-01-25 DIAGNOSIS — Z13820 Encounter for screening for osteoporosis: Secondary | ICD-10-CM | POA: Diagnosis not present

## 2020-01-25 NOTE — Progress Notes (Signed)
   Subjective:    Patient ID: Amanda Patterson, female    DOB: 1956-06-01, 64 y.o.   MRN: [id number]  HPI Here for chest pains that started early yesterday morning. They persisted all day yesterday and even early this morning, hen they stopped. At present she feels fine with no pain at all. She describes these pains as sharp in nature, and they were centered in the left anterio chest area and also around the left lateral chest. Movements made the pain worse, such as twisting her trunk or taking a deep breath. No SOB or nausea or sweats.    Review of Systems  Constitutional: Negative.   Respiratory: Negative.   Cardiovascular: Positive for chest pain. Negative for palpitations and leg swelling.  Gastrointestinal: Negative.        Objective:   Physical Exam Constitutional:      General: She is not in acute distress.    Appearance: Normal appearance.  Cardiovascular:     Rate and Rhythm: Normal rate and regular rhythm.     Pulses: Normal pulses.     Heart sounds: Normal heart sounds.     Comments: EKG today is unremarkable   Pulmonary:     Effort: Pulmonary effort is normal.     Breath sounds: Normal breath sounds.     Comments: I cannot elicit any tenderness in the chest area  Chest:     Chest wall: No tenderness.  Neurological:     General: No focal deficit present.     Mental Status: She is alert and oriented to person, place, and time.           Assessment & Plan:  She seems to have had a bout of costochondritis, and this has resolved. I reassured her of the benign and self limited nature of this. If the pain returns she can try Ibuprofen. Follow up prn.  Alysia Penna, MD

## 2020-01-25 NOTE — Progress Notes (Signed)
   Subjective:    Patient ID: Amanda Patterson, female    DOB: 01-08-1956, 64 y.o.   MRN: [id number]  HPI Duplicate note   Review of Systems     Objective:   Physical Exam        Assessment & Plan:

## 2020-02-11 ENCOUNTER — Telehealth: Payer: Self-pay | Admitting: Internal Medicine

## 2020-02-11 ENCOUNTER — Other Ambulatory Visit: Payer: Self-pay

## 2020-02-11 DIAGNOSIS — Z1211 Encounter for screening for malignant neoplasm of colon: Secondary | ICD-10-CM

## 2020-02-11 NOTE — Telephone Encounter (Signed)
Called patient and let her know that I have placed the referral for her to Dr. Owens Loffler. Patient verbalized an understanding.

## 2020-02-11 NOTE — Telephone Encounter (Signed)
Pt would like a referal for a colonoscopy to the doctor her husband saw- Dr. Owens Loffler  Pt can be reached at [phone]

## 2020-02-11 NOTE — Telephone Encounter (Signed)
OK to place referral

## 2020-02-11 NOTE — Telephone Encounter (Signed)
Yes please do referral fo colonscopy dr Ardis Hughs as per request

## 2020-02-12 ENCOUNTER — Encounter: Payer: Self-pay | Admitting: Gastroenterology

## 2020-03-12 ENCOUNTER — Encounter: Payer: BC Managed Care – PPO | Admitting: Gastroenterology

## 2020-03-13 DIAGNOSIS — H6123 Impacted cerumen, bilateral: Secondary | ICD-10-CM | POA: Diagnosis not present

## 2020-03-17 ENCOUNTER — Other Ambulatory Visit: Payer: Self-pay | Admitting: Internal Medicine

## 2020-03-22 ENCOUNTER — Other Ambulatory Visit: Payer: Self-pay | Admitting: Internal Medicine

## 2020-03-22 DIAGNOSIS — I340 Nonrheumatic mitral (valve) insufficiency: Secondary | ICD-10-CM

## 2020-03-22 DIAGNOSIS — I341 Nonrheumatic mitral (valve) prolapse: Secondary | ICD-10-CM

## 2020-03-22 DIAGNOSIS — R0789 Other chest pain: Secondary | ICD-10-CM

## 2020-05-01 ENCOUNTER — Telehealth: Payer: Self-pay | Admitting: Internal Medicine

## 2020-05-01 NOTE — Telephone Encounter (Signed)
Patient's BP is fluctuating but mostly running a little low.  Pt has stopped taking your BP medicine for the last 3 days.  Pt has appt on Monday for this reason.  She is requesting a call back for assistance on the medication.

## 2020-05-02 ENCOUNTER — Encounter: Payer: Self-pay | Admitting: Internal Medicine

## 2020-05-02 ENCOUNTER — Telehealth (INDEPENDENT_AMBULATORY_CARE_PROVIDER_SITE_OTHER): Payer: BC Managed Care – PPO | Admitting: Internal Medicine

## 2020-05-02 ENCOUNTER — Other Ambulatory Visit: Payer: Self-pay

## 2020-05-02 VITALS — BP 124/80 | Ht 62.0 in | Wt 129.0 lb

## 2020-05-02 DIAGNOSIS — Z79899 Other long term (current) drug therapy: Secondary | ICD-10-CM

## 2020-05-02 DIAGNOSIS — R519 Headache, unspecified: Secondary | ICD-10-CM | POA: Diagnosis not present

## 2020-05-02 DIAGNOSIS — I10 Essential (primary) hypertension: Secondary | ICD-10-CM | POA: Diagnosis not present

## 2020-05-02 MED ORDER — AMLODIPINE BESYLATE 2.5 MG PO TABS: 2.5000 mg | ORAL_TABLET | Freq: Every day | ORAL | 0 refills | Status: DC

## 2020-05-02 NOTE — Telephone Encounter (Signed)
Need more information  To answer  Please contact her   Which medicine ?  What areBP  readings when did she stop what are they now.?   Last visit with me was 7 2020  ? If correct ?  We can do a 4 pm virtual today  And cancel the Monday appt if needed

## 2020-05-02 NOTE — Progress Notes (Signed)
Virtual Visit via Video Note  I connected with@ on 05/02/20 at  4:00 PM EDT by a video enabled telemedicine application and verified that I am speaking with the correct person using two identifiers. Location patient: home Location provider:work office Persons participating in the virtual visit: patient, provider  WIth national recommendations  regarding COVID 19 pandemic   video visit is advised over in office visit for this patient.  Patient aware  of the limitations of evaluation and management by telemedicine and  availability of in person appointments. and agreed to proceed.   HPI: Amanda Patterson presents for video visit  concern about BP    Low readings  SDA  Having recent bifrontal seeming ly mild ha  And  Began to check bp readings on her amlodipine 5 and 1/2 nadolol per day   bp June 22 106/73 and 114/73  Number of readings logged and reported and so held taking  amlodipine med the last 4 days and bp back up to 124/80 136/86 124/74  Pulse today in 70 range   She and spose doing keto diet  She has  Lost 2 pounds  He a bit more but ok . No new sx and no assic [place] wth the ha   Took tylenol  Some help     ROS: See pertinent positives and negatives per HPI. No cp sob syncope neuro sx   Past Medical History:  Diagnosis Date  . BP (high blood pressure)   . Chicken pox   . Headache(784.0)   . High cholesterol   . Hx of varicella   . Murmur, heart    MVP by hx  stress echo 2012 midl post prolapse valve  . Pain in the chest 10/22/2014   poss gi related  uncertain has risk factors  cont protonix cards check rov in 1-2 months     Past Surgical History:  Procedure Laterality Date  . BIOPSY BREAST    . BREAST EXCISIONAL BIOPSY Bilateral    Approx. 15 years ago  . CESAREAN SECTION  [phone]    Family History  Problem Relation Age of Onset  . Diabetes Mother   . Hypertension Mother   . Stroke Father   . Hypertension Father   . Heart disease Paternal Grandmother   . Stroke  Brother   . Heart attack Neg Hx   . Breast cancer Neg Hx     Social History   Tobacco Use  . Smoking status: Never Smoker  . Smokeless tobacco: Never Used  Vaping Use  . Vaping Use: Never used  Substance Use Topics  . Alcohol use: No  . Drug use: No      Current Outpatient Medications:  .  Ascorbic Acid (VITAMIN C) 1000 MG tablet, Take 1,000 mg by mouth daily., Disp: , Rfl:  .  Cholecalciferol (VITAMIN D3) 2000 UNITS TABS, Take 1 tablet by mouth daily., Disp: , Rfl:  .  nadolol (CORGARD) 20 MG tablet, TAKE 1/2 TABLET BY MOUTH EVERY DAY, Disp: 45 tablet, Rfl: 0 .  pantoprazole (PROTONIX) 40 MG tablet, TAKE 1 TABLET BY MOUTH EVERY DAY, Disp: 90 tablet, Rfl: 1 .  Psyllium (METAMUCIL PO), Take 1 packet by mouth daily. , Disp: , Rfl:  .  amLODipine (NORVASC) 2.5 MG tablet, Take 1 tablet (2.5 mg total) by mouth daily for 90 doses., Disp: 90 tablet, Rfl: 0  EXAM: BP Readings from Last 3 Encounters:  05/02/20 124/80  01/25/20 140/84  07/24/19 (!) 150/82   [facility]  Readings from Last 3 Encounters:  05/02/20 129 lb (58.5 kg)  01/25/20 133 lb 9.6 oz (60.6 kg)  07/24/19 138 lb (62.6 kg)      VITALS per patient if applicable:  Patient reported log   GENERAL: alert, oriented, appears well and in no acute distress  HEENT: atraumatic, conjunttiva clear, no obvious abnormalities on inspection of external nose and ears NECK: normal movements of the head and neck LUNGS: on inspection no signs of respiratory distress, breathing rate appears normal, no obvious gross SOB, gasping or wheezing CV: no obvious cyanosis  PSYCH/NEURO: pleasant and cooperative, no obvious depression or anxiety, speech and thought processing grossly intact Lab Results  Component Value Date   WBC 8.8 07/31/2019   HGB 15.1 07/31/2019   HCT 44.7 07/31/2019   PLT 345 07/31/2019   GLUCOSE 110 (H) 07/31/2019   CHOL 183 07/31/2019   TRIG 104 07/31/2019   HDL 51 07/31/2019   LDLDIRECT 145.4 08/21/2012   LDLCALC 113  (H) 07/31/2019   ALT 24 07/31/2019   AST 25 07/31/2019   NA 142 07/31/2019   K 4.8 07/31/2019   CL 103 07/31/2019   CREATININE 0.78 07/31/2019   BUN 10 07/31/2019   CO2 24 07/31/2019   TSH 2.530 07/31/2019   HGBA1C 6.4 06/06/2018    ASSESSMENT AND PLAN:  Discussed the following assessment and plan:    ICD-10-CM   1. Essential hypertension  I10    low bp readings recently  2. Medication management  Z79.899   3. Nonintractable headache, unspecified chronicity pattern, unspecified headache type  R51.9    Appears  that lifestyle  style   Assoc with good and sometimes low bp  Uncertain if causing the  HA mild frontal but   Try 2.5 mg dosing for now    Plan cpx in the fall  Send [place] update on readings in a month visit as needed  Goal; 120/80 range  Due for cards fu in September Dr.  Meda Coffee Counseled.   Expectant management and discussion of plan and treatment with opportunity to ask questions and all were answered. The patient agreed with the plan and demonstrated an understanding of the instructions.   Advised to call back or seek an in-person evaluation if worsening  or having  further concerns . Return for 1 mos update and cpx in fall or as needed .   Shanon Ace, MD

## 2020-05-02 NOTE — Telephone Encounter (Signed)
Please advise with any suggestions so I can advise patient.

## 2020-05-02 NOTE — Telephone Encounter (Signed)
Called patient and LMOVM to return call  Called and left patient a detailed voice message to let her know to call and schedule the MyChart virtual visit at 4pm today per Dr. Regis Bill if she can.

## 2020-05-05 ENCOUNTER — Telehealth: Payer: BC Managed Care – PPO | Admitting: Internal Medicine

## 2020-05-28 IMAGING — MG MM DIGITAL DIAGNOSTIC UNILAT*L* W/ TOMO W/ CAD
4 series · 4 of 12 positions shown · non-contrast
Comparison: Previous exam(s).

CLINICAL DATA: Recall from screening mammography with
tomosynthesis, possible mass involving the OUTER LEFT breast at
ANTERIOR to MIDDLE depth.

EXAM:
DIGITAL DIAGNOSTIC LEFT MAMMOGRAM WITH TOMO
ULTRASOUND LEFT BREAST

[L CC synth-2D]
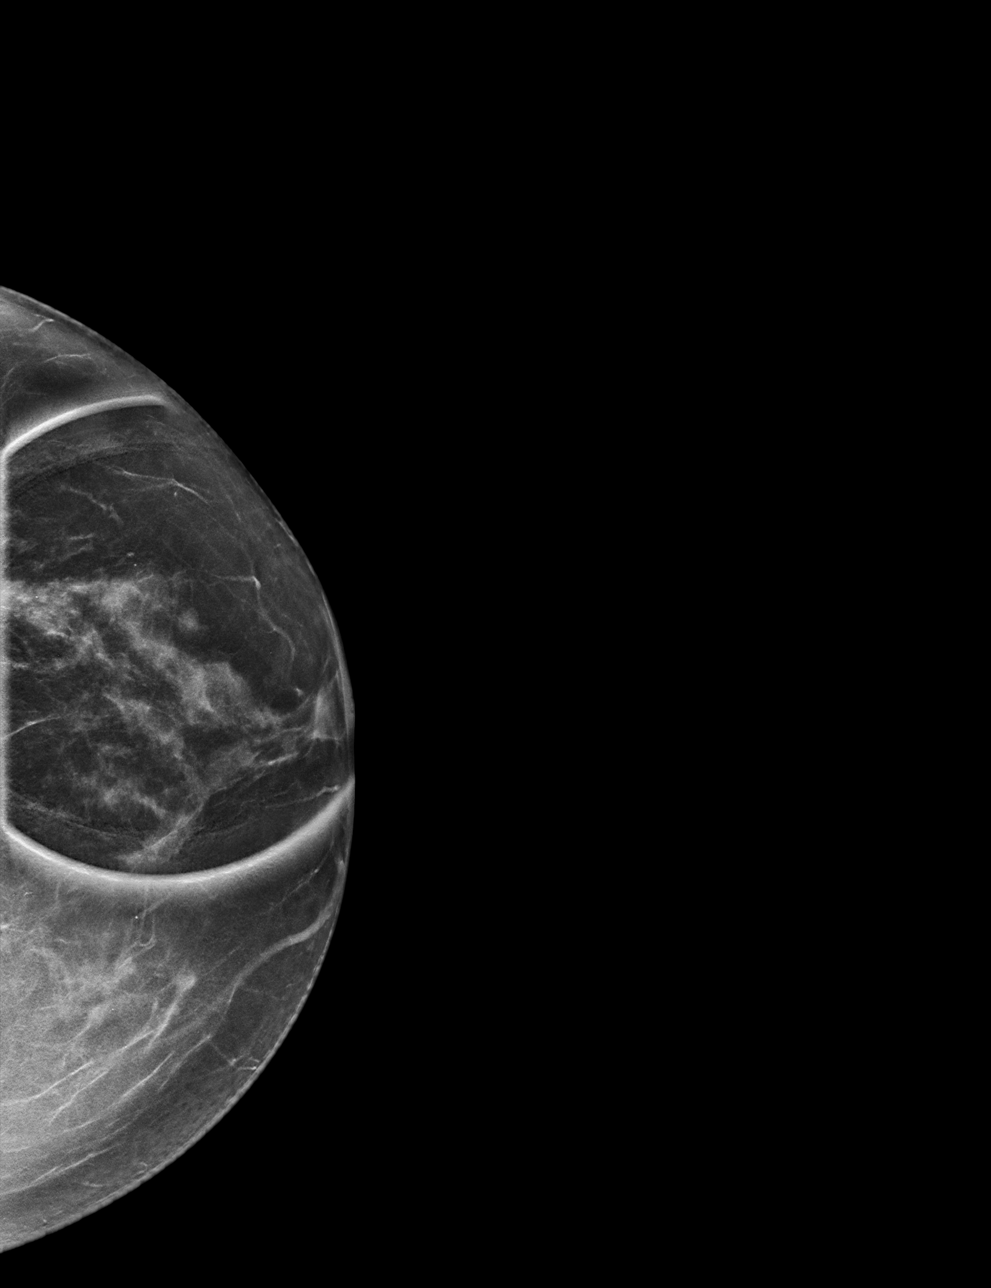

[L MLO synth-2D]
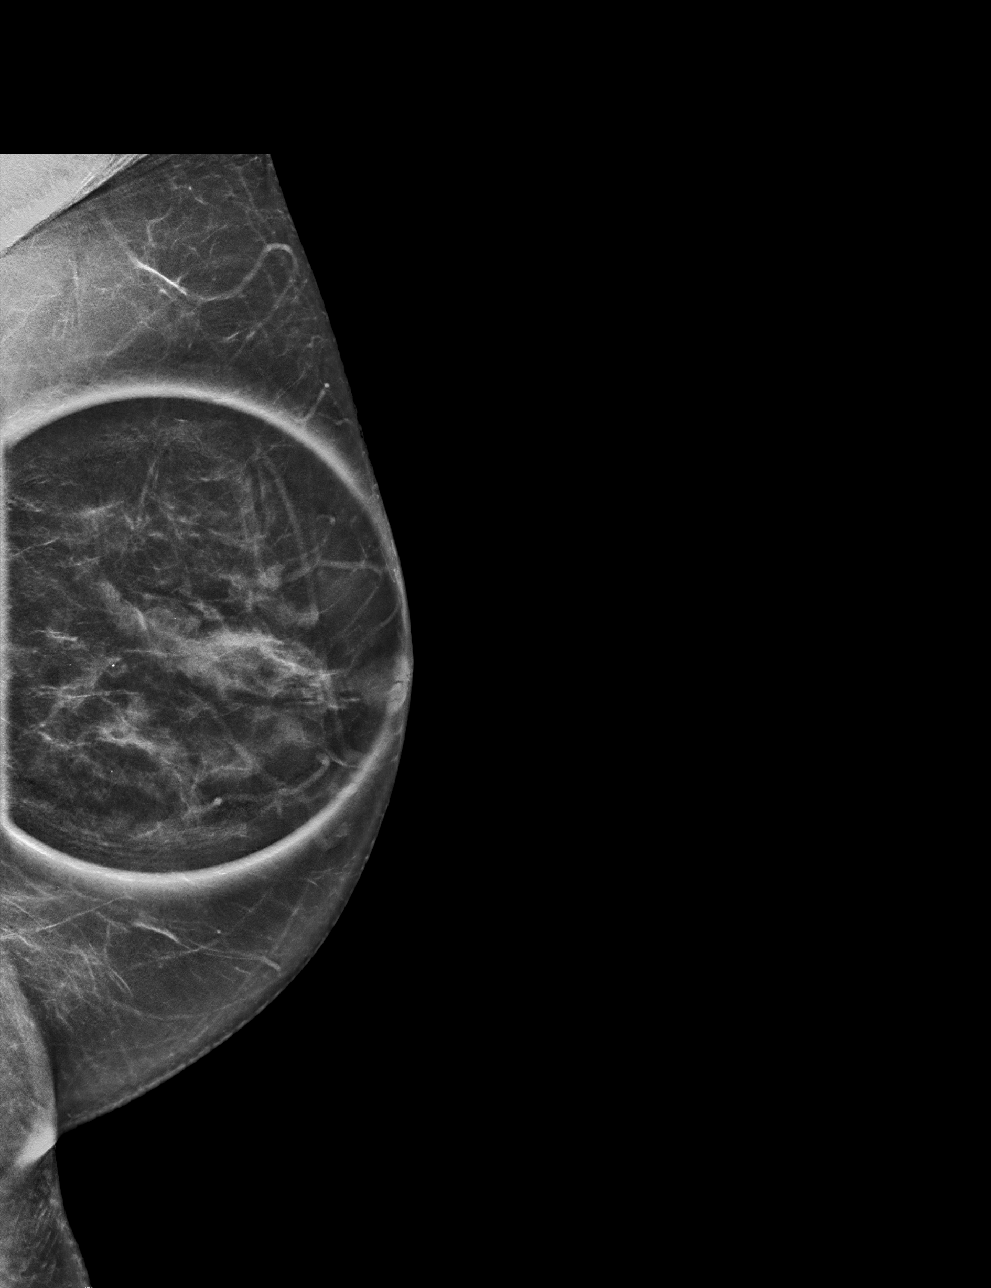

[L CC tomo · tomo slice 27/54.0]
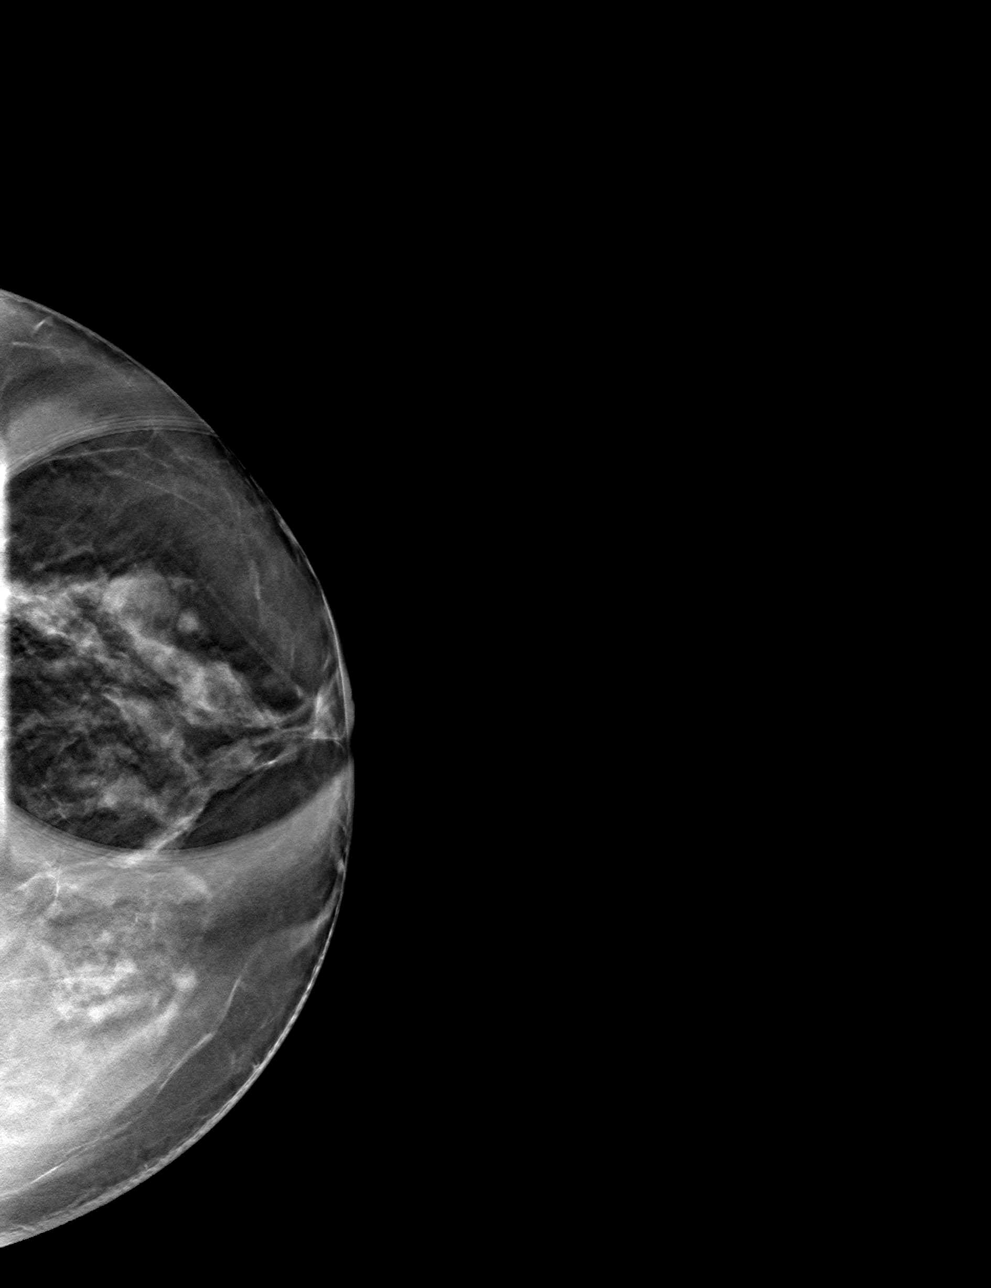

[L MLO tomo · tomo slice 31/61.0]
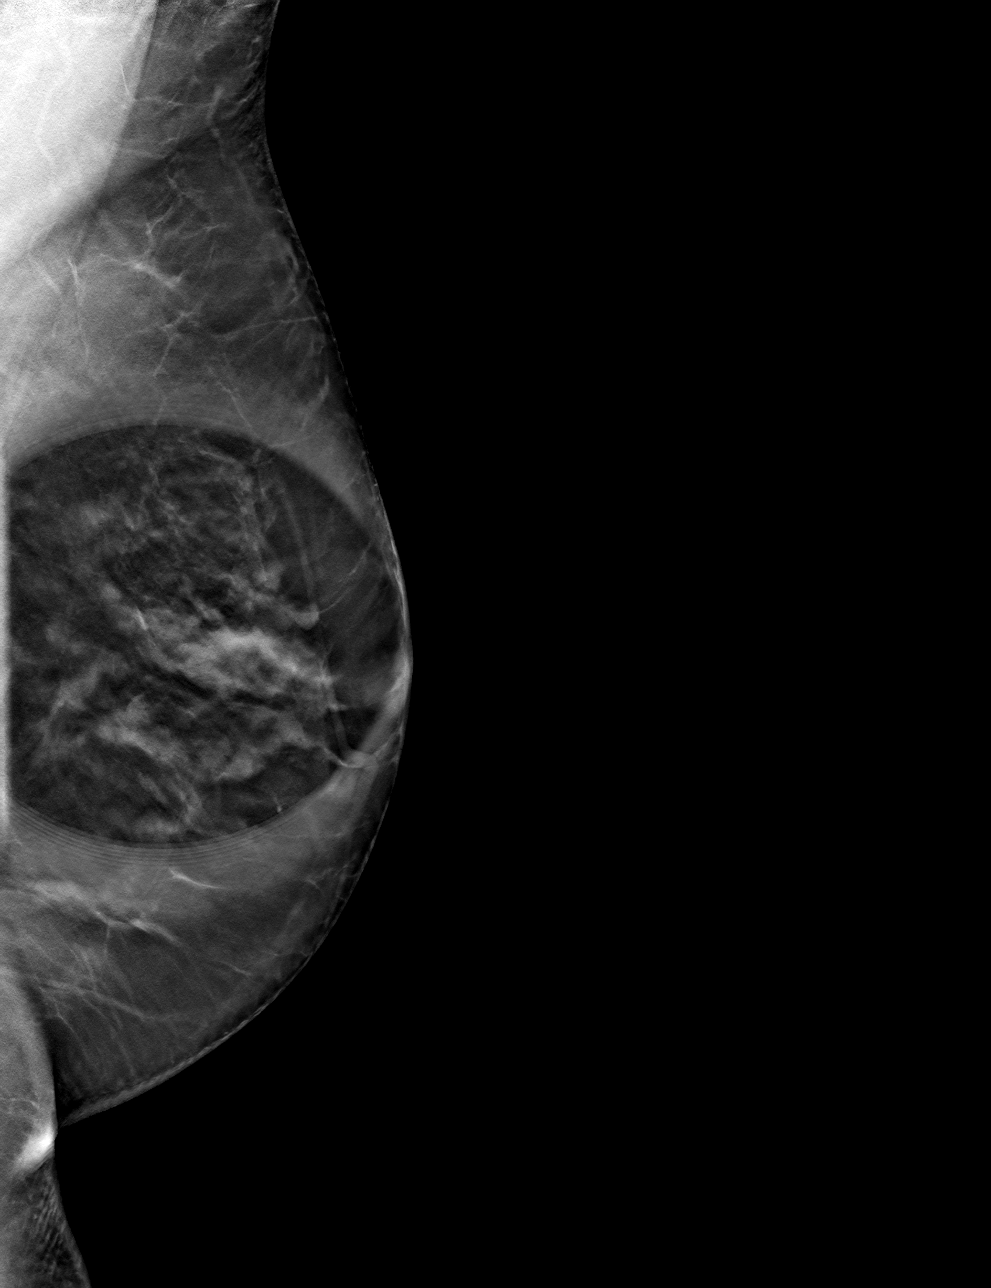

[4 of 12 positions shown; findings below may reference images not displayed]

ACR Breast Density Category b: There are scattered areas of
fibroglandular density.
FINDINGS: Tomosynthesis and synthesized spot-compression CC and MLO views of
the area of concern in the LEFT breast were obtained.

A partially obscured isodense mass measuring approximately 1 cm is
confirmed in the OUTER breast at ANTERIOR to MIDDLE depth within an
island of fibroglandular tissue. There is no associated
architectural distortion or suspicious calcifications. The visible
margins are circumscribed.

Targeted LEFT breast ultrasound is performed, showing a benign
simple cyst at the 3 o'clock position approximately 2 cm from nipple
at MIDDLE depth, measuring approximately 0.9 x 1.0 x 1.4 cm, within
an island of fibroglandular tissue, demonstrating posterior acoustic
enhancement and no internal power Doppler flow, corresponding to the
screening mammographic finding.
IMPRESSION: 1. No mammographic or sonographic evidence of malignancy involving
the LEFT breast.
2. Benign 1.4 cm simple cyst involving the OUTER LEFT breast which
accounts for the screening mammographic finding.

RECOMMENDATION:
Screening mammogram in one year.(Code:[id number]-ESR)

I have discussed the findings and recommendations with the patient.
If applicable, a reminder letter will be sent to the patient
regarding the next appointment.

BI-RADS CATEGORY  2: Benign.

## 2020-06-26 ENCOUNTER — Telehealth: Payer: Self-pay | Admitting: Internal Medicine

## 2020-06-26 NOTE — Telephone Encounter (Signed)
Pt is calling to get a refill on medication   nadolol (CORGARD) 20 MG tablet   [id number] - [place], [place] - [address]  [address] [place], [place] [address]  Phone:  [phone] Fax:  [phone]   Please advise

## 2020-06-27 ENCOUNTER — Other Ambulatory Visit: Payer: Self-pay

## 2020-06-27 MED ORDER — NADOLOL 20 MG PO TABS: 10.0000 mg | ORAL_TABLET | Freq: Every day | ORAL | 0 refills | Status: DC

## 2020-06-27 NOTE — Telephone Encounter (Signed)
Refill has been sent to pharmacy requested. Patient needs to schedule her yearly visit with labs for refills.

## 2020-07-22 ENCOUNTER — Other Ambulatory Visit: Payer: Self-pay | Admitting: Internal Medicine

## 2020-07-22 NOTE — Telephone Encounter (Signed)
Please advise if you want to continue the same dosage per instructions?

## 2020-07-23 NOTE — Telephone Encounter (Signed)
Pt called to check on the px she stated she has 4 left

## 2020-07-31 DIAGNOSIS — E785 Hyperlipidemia, unspecified: Secondary | ICD-10-CM | POA: Diagnosis not present

## 2020-08-18 ENCOUNTER — Ambulatory Visit (INDEPENDENT_AMBULATORY_CARE_PROVIDER_SITE_OTHER): Payer: BC Managed Care – PPO

## 2020-08-18 ENCOUNTER — Other Ambulatory Visit: Payer: Self-pay

## 2020-08-18 DIAGNOSIS — Z23 Encounter for immunization: Secondary | ICD-10-CM

## 2020-08-19 ENCOUNTER — Ambulatory Visit: Payer: BC Managed Care – PPO | Attending: Internal Medicine

## 2020-08-19 DIAGNOSIS — Z23 Encounter for immunization: Secondary | ICD-10-CM

## 2020-08-19 NOTE — Progress Notes (Signed)
   Covid-19 Vaccination Clinic  Name:  Amanda Patterson    MRN: [id number] DOB: [birth year]  08/19/2020  Amanda Patterson was observed post Covid-19 immunization for 15 minutes without incident. She was provided with Vaccine Information Sheet and instruction to access the V-Safe system.   Amanda Patterson was instructed to call 911 with any severe reactions post vaccine: Marland Kitchen Difficulty breathing  . Swelling of face and throat  . A fast heartbeat  . A bad rash all over body  . Dizziness and weakness

## 2020-09-14 ENCOUNTER — Other Ambulatory Visit: Payer: Self-pay | Admitting: Internal Medicine

## 2020-09-24 ENCOUNTER — Encounter: Payer: Self-pay | Admitting: Gastroenterology

## 2020-10-08 DIAGNOSIS — H40053 Ocular hypertension, bilateral: Secondary | ICD-10-CM | POA: Diagnosis not present

## 2020-10-20 ENCOUNTER — Other Ambulatory Visit: Payer: Self-pay | Admitting: Internal Medicine

## 2020-10-27 DIAGNOSIS — Z1152 Encounter for screening for COVID-19: Secondary | ICD-10-CM | POA: Diagnosis not present

## 2020-11-18 ENCOUNTER — Ambulatory Visit (AMBULATORY_SURGERY_CENTER): Payer: Self-pay | Admitting: *Deleted

## 2020-11-18 ENCOUNTER — Other Ambulatory Visit: Payer: Self-pay

## 2020-11-18 VITALS — Ht 62.0 in | Wt 130.0 lb

## 2020-11-18 DIAGNOSIS — Z1211 Encounter for screening for malignant neoplasm of colon: Secondary | ICD-10-CM

## 2020-11-18 MED ORDER — NA SULFATE-K SULFATE-MG SULF 17.5-3.13-1.6 GM/177ML PO SOLN: 1.0000 | Freq: Once | ORAL | 0 refills | Status: AC

## 2020-11-18 NOTE — Progress Notes (Signed)

## 2020-11-27 ENCOUNTER — Encounter: Payer: Self-pay | Admitting: Gastroenterology

## 2020-12-02 ENCOUNTER — Other Ambulatory Visit: Payer: Self-pay

## 2020-12-02 ENCOUNTER — Ambulatory Visit (AMBULATORY_SURGERY_CENTER): Payer: BC Managed Care – PPO | Admitting: Gastroenterology

## 2020-12-02 ENCOUNTER — Other Ambulatory Visit: Payer: Self-pay | Admitting: Gastroenterology

## 2020-12-02 ENCOUNTER — Encounter: Payer: Self-pay | Admitting: Gastroenterology

## 2020-12-02 VITALS — BP 110/67 | HR 77 | Temp 97.2°F | Resp 14 | Ht 62.0 in | Wt 130.0 lb

## 2020-12-02 DIAGNOSIS — D122 Benign neoplasm of ascending colon: Secondary | ICD-10-CM | POA: Diagnosis not present

## 2020-12-02 DIAGNOSIS — Z1211 Encounter for screening for malignant neoplasm of colon: Secondary | ICD-10-CM

## 2020-12-02 MED ORDER — SODIUM CHLORIDE 0.9 % IV SOLN: 500.0000 mL | Freq: Once | INTRAVENOUS | Status: DC

## 2020-12-02 NOTE — Op Note (Signed)
[place] Patient Name: Amanda Patterson Procedure Date: 12/02/2020 10:07 AM MRN: [id number] Endoscopist: Milus Banister , MD Age: 65 Referring MD:  Date of Birth: Oct 05, 1956 Gender: Female Account #: [id number] Procedure:                Colonoscopy Indications:              Screening for colorectal malignant neoplasm;                            Colonoscopy Donna 2011 found no polyps Medicines:                Monitored Anesthesia Care Procedure:                Pre-Anesthesia Assessment:                           - Prior to the procedure, a History and Physical                            was performed, and patient medications and                            allergies were reviewed. The patient's tolerance of                            previous anesthesia was also reviewed. The risks                            and benefits of the procedure and the sedation                            options and risks were discussed with the patient.                            All questions were answered, and informed consent                            was obtained. Prior Anticoagulants: The patient has                            taken no previous anticoagulant or antiplatelet                            agents. ASA Grade Assessment: II - A patient with                            mild systemic disease. After reviewing the risks                            and benefits, the patient was deemed in                            satisfactory condition to undergo the procedure.  After obtaining informed consent, the colonoscope                            was passed under direct vision. Throughout the                            procedure, the patient's blood pressure, pulse, and                            oxygen saturations were monitored continuously. The                            Colonoscope was introduced through the anus and                            advanced to the the cecum,  identified by                            appendiceal orifice and ileocecal valve. The                            colonoscopy was performed without difficulty. The                            patient tolerated the procedure well. The quality                            of the bowel preparation was good.                           The ileocecal valve, appendiceal orifice, and                            rectum were photographed but images could not be                            saved due to a system wide Provation issue. Scope In: 10:21:11 AM Scope Out: 10:33:49 AM Scope Withdrawal Time: 0 hours 9 minutes 33 seconds  Total Procedure Duration: 0 hours 12 minutes 38 seconds  Findings:                 A 3 mm polyp was found in the ascending colon. The                            polyp was sessile. The polyp was removed with a                            cold snare. Resection and retrieval were complete.                           The exam was otherwise without abnormality on                            direct and retroflexion  views. Complications:            No immediate complications. Estimated blood loss:                            None. Estimated Blood Loss:     Estimated blood loss: none. Impression:               - One 3 mm polyp in the ascending colon, removed                            with a cold snare. Resected and retrieved.                           - The examination was otherwise normal on direct                            and retroflexion views. Recommendation:           - Patient has a contact number available for                            emergencies. The signs and symptoms of potential                            delayed complications were discussed with the                            patient. Return to normal activities tomorrow.                            Written discharge instructions were provided to the                            patient.                           - Resume  previous diet.                           - Continue present medications.                           - Await pathology results. Milus Banister, MD 12/02/2020 10:36:26 AM This report has been signed electronically.

## 2020-12-02 NOTE — Progress Notes (Signed)
Pt's states no medical or surgical changes since previsit or office visit. 

## 2020-12-02 NOTE — Progress Notes (Signed)
C.W. vital signs. 

## 2020-12-02 NOTE — Progress Notes (Signed)
Called to room to assist during endoscopic procedure.  Patient ID and intended procedure confirmed with present staff. Received instructions for my participation in the procedure from the performing physician.  

## 2020-12-02 NOTE — Patient Instructions (Signed)
YOU HAD AN ENDOSCOPIC PROCEDURE TODAY AT THE [facility]:   Refer to the procedure report that was given to you for any specific questions about what was found during the examination.  If the procedure report does not answer your questions, please call your gastroenterologist to clarify.  If you requested that your care partner not be given the details of your procedure findings, then the procedure report has been included in a sealed envelope for you to review at your convenience later.  YOU SHOULD EXPECT: Some feelings of bloating in the abdomen. Passage of more gas than usual.  Walking can help get rid of the air that was put into your GI tract during the procedure and reduce the bloating. If you had a lower endoscopy (such as a colonoscopy or flexible sigmoidoscopy) you may notice spotting of blood in your stool or on the toilet paper. If you underwent a bowel prep for your procedure, you may not have a normal bowel movement for a few days.  Please Note:  You might notice some irritation and congestion in your nose or some drainage.  This is from the oxygen used during your procedure.  There is no need for concern and it should clear up in a day or so.  SYMPTOMS TO REPORT IMMEDIATELY:   Following lower endoscopy (colonoscopy or flexible sigmoidoscopy):  Excessive amounts of blood in the stool  Significant tenderness or worsening of abdominal pains  Swelling of the abdomen that is new, acute  Fever of 100F or higher  For urgent or emergent issues, a gastroenterologist can be reached at any hour by calling [phone]. Do not use MyChart messaging for urgent concerns.    DIET:  We do recommend a small meal at first, but then you may proceed to your regular diet.  Drink plenty of fluids but you should avoid alcoholic beverages for 24 hours.  ACTIVITY:  You should plan to take it easy for the rest of today and you should NOT DRIVE or use heavy machinery until tomorrow (because  of the sedation medicines used during the test).    FOLLOW UP: Our staff will call the number listed on your records 48-72 hours following your procedure to check on you and address any questions or concerns that you may have regarding the information given to you following your procedure. If we do not reach you, we will leave a message.  We will attempt to reach you two times.  During this call, we will ask if you have developed any symptoms of COVID 19. If you develop any symptoms (ie: fever, flu-like symptoms, shortness of breath, cough etc.) before then, please call [phone].  If you test positive for Covid 19 in the 2 weeks post procedure, please call and report this information to us.    If any biopsies were taken you will be contacted by phone or by letter within the next 1-3 weeks.  Please call us at [phone] if you have not heard about the biopsies in 3 weeks.    SIGNATURES/CONFIDENTIALITY: You and/or your care partner have signed paperwork which will be entered into your electronic medical record.  These signatures attest to the fact that that the information above on your After Visit Summary has been reviewed and is understood.  Full responsibility of the confidentiality of this discharge information lies with you and/or your care-partner. 

## 2020-12-02 NOTE — Progress Notes (Signed)
PT taken to PACU. Monitors in place. VSS. Report given to RN. 

## 2020-12-04 ENCOUNTER — Telehealth: Payer: Self-pay

## 2020-12-04 NOTE — Telephone Encounter (Signed)
  Follow up Call-  Call back number 12/02/2020  Post procedure Call Back phone  # [phone]  Permission to leave phone message Yes  Some recent data might be hidden     Patient questions:  Do you have a fever, pain , or abdominal swelling? No. Pain Score  0 *  Have you tolerated food without any problems? Yes.    Have you been able to return to your normal activities? Yes.    Do you have any questions about your discharge instructions: Diet   No. Medications  No. Follow up visit  No.  Do you have questions or concerns about your Care? No.  Actions: * If pain score is 4 or above: No action needed, pain <4.  1. Have you developed a fever since your procedure? no  2.   Have you had an respiratory symptoms (SOB or cough) since your procedure? no  3.   Have you tested positive for COVID 19 since your procedure no  4.   Have you had any family members/close contacts diagnosed with the COVID 19 since your procedure?  no   If yes to any of these questions please route to Joylene John, RN and Joella Prince, RN

## 2020-12-09 ENCOUNTER — Other Ambulatory Visit: Payer: Self-pay | Admitting: Internal Medicine

## 2020-12-09 DIAGNOSIS — Z1231 Encounter for screening mammogram for malignant neoplasm of breast: Secondary | ICD-10-CM

## 2020-12-15 ENCOUNTER — Encounter: Payer: Self-pay | Admitting: Gastroenterology

## 2020-12-22 ENCOUNTER — Telehealth: Payer: Self-pay | Admitting: Internal Medicine

## 2020-12-22 NOTE — Telephone Encounter (Signed)
Disregard

## 2020-12-23 NOTE — Progress Notes (Signed)
Chief Complaint  Patient presents with   Annual Exam    Physical     HPI: Patient  Amanda Patterson  65 y.o. comes in today for Preventive Health Care visit  And med check   Bp  Not checking a lot recently taking amlodipine 2.3 and naldolol  No syncope new cp sob  Less exercise recently from work sitting many meetings .  About month if right lower back " hip"  But points to right lower back flank and buttocks area  No injury but lots of lifting   Trash bags other .   Rest and laying gon heating pad helps no assoc sx or radiation To have gyne check soon .   Health Maintenance  Topic Date Due   HIV Screening  Never done   PAP SMEAR-Modifier  11/16/2020   MAMMOGRAM  12/06/2021   TETANUS/TDAP  02/28/2024   COLONOSCOPY (Pts 45-70yrs Insurance coverage will need to be confirmed)  12/03/2027   INFLUENZA VACCINE  Completed   COVID-19 Vaccine  Completed   Hepatitis C Screening  Completed   Health Maintenance Review LIFESTYLE:  Exercise:  Lacking recently .   soom some .  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 GI /GU: No adominal pain, vomiting, change in bowel habits. No blood in the stool. No significant GU symptoms. SKIN/HEME: ,no acute skin rashes suspicious lesions or bleeding. No lymphadenopathy, nodules, masses.  NEURO/ PSYCH:  No neurologic signs such as weakness numbness. No depression anxiety. IMM/ Allergy: No unusual infections.  Allergy .   REST of 12 system review negative except as per HPI   Past Medical History:  Diagnosis Date   BP (high blood pressure)    Chicken pox    GERD (gastroesophageal reflux disease)    Headache(784.0)    High  cholesterol    Hx of varicella    Murmur, heart    MVP by hx  stress echo 2012 midl post prolapse valve   Pain in the chest 10/22/2014   poss gi related  uncertain has risk factors  cont protonix cards check rov in 1-2 months    Palpitations     Past Surgical History:  Procedure Laterality Date   BIOPSY BREAST     BREAST EXCISIONAL BIOPSY Bilateral    Approx. 39 years ago   CESAREAN SECTION  [phone]   COLONOSCOPY      Family History  Problem Relation Age of Onset   Diabetes Mother    Hypertension Mother    Stroke Father    Hypertension Father    Heart disease Paternal Grandmother    Stroke Brother    Heart attack Neg Hx    Breast cancer Neg Hx    Colon polyps Neg Hx    Colon cancer Neg Hx    Esophageal cancer Neg Hx    Stomach cancer Neg Hx    Rectal cancer Neg Hx     Social History   Socioeconomic History   Marital status: Married    Spouse name: Not on file   Number of children: Not on file   Years of education: Not on file   Highest education level: Not on file  Occupational History   Not on file  Tobacco Use   Smoking status: Never Smoker   Smokeless tobacco: Never Used  Vaping Use   Vaping Use: Never used  Substance and Sexual Activity   Alcohol use: No   Drug use: No   Sexual activity: Not on file  Other Topics Concern   Not on file  Social History Narrative   7 hours of sleep per night   2 people living in the home     Considers her health good   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    Walking 5 day per week.    Now  [place] for a few years still has residence in [place] Strain: Not on file  Food Insecurity: Not on file  Transportation Needs: Not on file  Physical Activity: Not on file  Stress: Not on file  Social  Connections: Not on file    Outpatient Medications Prior to Visit  Medication Sig Dispense Refill   Ascorbic Acid (VITAMIN C) 1000 MG tablet Take 1,000 mg by mouth daily.     BLACK ELDERBERRY PO Take by mouth.     Cholecalciferol (VITAMIN D3) 2000 UNITS TABS Take 1 tablet by mouth daily.     nadolol (CORGARD) 20 MG tablet TAKE 1/2 TABLET BY MOUTH DAILY. PLEASE SCHEDULE YOUR YEARLY VISIT WITH LABS FOR REFILLS. [phone] 45 tablet 0   pantoprazole (PROTONIX) 40 MG tablet TAKE 1 TABLET BY MOUTH EVERY DAY 90 tablet 1   amLODipine (NORVASC) 2.5 MG tablet TAKE 1 TABLET (2.5 MG TOTAL) BY MOUTH DAILY FOR 90 DOSES. 90 tablet 3   Psyllium (METAMUCIL PO) Take 1 packet by mouth daily.  (Patient not taking: Reported on 12/24/2020)     No facility-administered medications prior to visit.     EXAM:  BP (!) 170/105    Pulse 85    Temp 98.2 F (36.8 C) (Oral)    Ht 5\' 2"  (1.575 m)    Wt 136 lb (61.7 kg)    SpO2 99%    BMI 24.87 kg/m   Body mass index is 24.87 kg/m. Wt Readings from Last 3 Encounters:  12/24/20 136 lb (61.7 kg)  12/02/20 130 lb (59 kg)  11/18/20 130 lb (59 kg)    Physical Exam: Vital signs reviewed QVZ:DGLO is a well-developed well-nourished alert cooperative    who appearsr stated age in no acute distress.  HEENT: normocephalic atraumatic , Eyes: PERRL EOM's full, conjunctiva clear, Nares: paten,t no deformity discharge or tenderness., Ears: no deformity EAC's clear TMs with normal landmarks. Mouth: masked  NECK: supple without masses, thyromegaly or bruits. CHEST/PULM:  Clear to auscultation and percussion breath sounds equal no wheeze , rales or rhonchi. No chest wall deformities or tenderness. Breast: normal by inspection . No dimpling, discharge, masses, tenderness or discharge . CV: PMI is nondisplaced, S1 S2 no gallops, murmurs, rubs. Mid dystolic click noted  Peripheral pulses are full without delay.No JVD .  ABDOMEN: Bowel sounds normal nontender  No guard or  rebound, no hepato splenomegal no CVA tenderness.  No hernia. Extremtities:  No clubbing cyanosis or edema, no acute joint swelling or redness no focal atrophyright  para lumbar spasm  Walks a biyt antalgic  No midline tenderness and neg slr  NEURO:  Oriented x3, cranial nerves 3-12 appear to be intact, no obvious focal weakness,gait within normal limits no abnormal reflexes or asymmetrical SKIN: No acute rashes normal turgor, color, no bruising or petechiae. PSYCH: Oriented, good eye contact, no obvious depression anxiety, cognition and judgment appear normal. LN: no cervical axillary inguinal adenopathy  Lab Results  Component Value Date   WBC 8.8 07/31/2019   HGB 15.1 07/31/2019   HCT 44.7 07/31/2019   PLT 345 07/31/2019   GLUCOSE 110 (H) 07/31/2019   CHOL 183 07/31/2019   TRIG 104 07/31/2019   HDL 51 07/31/2019   LDLDIRECT 145.4 08/21/2012   LDLCALC 113 (H) 07/31/2019   ALT 24 07/31/2019   AST 25 07/31/2019   NA 142 07/31/2019   K 4.8 07/31/2019   CL 103 07/31/2019   CREATININE 0.78 07/31/2019   BUN 10 07/31/2019   CO2 24 07/31/2019   TSH 2.530 07/31/2019   HGBA1C 6.4 06/06/2018    BP Readings from Last 3 Encounters:  12/24/20 (!) 170/105  12/02/20 110/67  05/02/20 124/80  repat bp was 170/90 also  ( says will go down not in office)  Lab results reviewed with patient   ASSESSMENT AND PLAN:  Discussed the following assessment and plan:    ICD-10-CM   1. Visit for preventive health examination  V89.38 Basic metabolic panel    CBC with Differential/Platelet    Hemoglobin A1c    Hepatic function panel    Lipid panel    TSH    POCT Urinalysis Dipstick (Automated)  2. Medication management  B01.751 Basic metabolic panel    CBC with Differential/Platelet    Hemoglobin A1c    Hepatic function panel    Lipid panel    TSH    POCT Urinalysis Dipstick (Automated)  3. Essential hypertension  W25 Basic metabolic panel    CBC with Differential/Platelet    Hemoglobin  A1c    Hepatic function panel    Lipid panel    TSH    POCT Urinalysis Dipstick (Automated)  4. Right-sided low back pain without sciatica, unspecified chronicity  E52.77 Basic metabolic panel    CBC with Differential/Platelet    Hemoglobin A1c    Hepatic function panel    Lipid panel    TSH    POCT Urinalysis Dipstick (Automated)    Ambulatory referral to Physical Therapy  5. MVP (mitral valve prolapse)  I34.1   6. Hyperglycemia  R73.9    Return for fu bp readings  in 2 weeks and then  plan follow up  as indicated after that.   .  Patient Care Team: Solangel Mcmanaway, Standley Brooking, MD as PCP - General (Internal Medicine) Arvella Nigh, MD (Obstetrics and Gynecology) Lewie Loron, [place] as Physician Assistant (Chiropractic Medicine) [name], MD as Consulting Physician (Cardiology) Patient Instructions   Ensure Bp control   Goal 130/80 or below .  Increase  Amlodipine to 5 mg per day .  Send in readings  In 2 weeks   Take twice a day for  3-5 days  If not controlled  We can do fu virtual visit or other advice .  Please make appt with Dr Meda Coffee cardiology .   Right back pain seems mechanical  Plan referral to Physical therapy .  caution with  Heating pad.   Fasting labs this week    Health Maintenance, Female Adopting a healthy lifestyle  and getting preventive care are important in promoting health and wellness. Ask your health care provider about:  The right schedule for you to have regular tests and exams.  Things you can do on your own to prevent diseases and keep yourself healthy. What should I know about diet, weight, and exercise? Eat a healthy diet  Eat a diet that includes plenty of vegetables, fruits, low-fat dairy products, and lean protein.  Do not eat a lot of foods that are high in solid fats, added sugars, or sodium.   Maintain a healthy weight Body mass index (BMI) is used to identify weight problems. It estimates body fat based on height and weight. Your  health care provider can help determine your BMI and help you achieve or maintain a healthy weight. Get regular exercise Get regular exercise. This is one of the most important things you can do for your health. Most adults should:  Exercise for at least 150 minutes each week. The exercise should increase your heart rate and make you sweat (moderate-intensity exercise).  Do strengthening exercises at least twice a week. This is in addition to the moderate-intensity exercise.  Spend less time sitting. Even light physical activity can be beneficial. Watch cholesterol and blood lipids Have your blood tested for lipids and cholesterol at 63 years of age, then have this test every 5 years. Have your cholesterol levels checked more often if:  Your lipid or cholesterol levels are high.  You are older than 63 years of age.  You are at high risk for heart disease. What should I know about cancer screening? Depending on your health history and family history, you may need to have cancer screening at various ages. This may include screening for:  Breast cancer.  Cervical cancer.  Colorectal cancer.  Skin cancer.  Lung cancer. What should I know about heart disease, diabetes, and high blood pressure? Blood pressure and heart disease  High blood pressure causes heart disease and increases the risk of stroke. This is more likely to develop in people who have high blood pressure readings, are of African descent, or are overweight.  Have your blood pressure checked: ? Every 3-5 years if you are 103-7 years of age. ? Every year if you are 40 years old or older. Diabetes Have regular diabetes screenings. This checks your fasting blood sugar level. Have the screening done:  Once every three years after age 78 if you are at a normal weight and have a low risk for diabetes.  More often and at a younger age if you are overweight or have a high risk for diabetes. What should I know about  preventing infection? Hepatitis B If you have a higher risk for hepatitis B, you should be screened for this virus. Talk with your health care provider to find out if you are at risk for hepatitis B infection. Hepatitis C Testing is recommended for:  Everyone born from [birth year] through 1963.  Anyone with known risk factors for hepatitis C. Sexually transmitted infections (STIs)  Get screened for STIs, including gonorrhea and chlamydia, if: ? You are sexually active and are younger than 63 years of age. ? You are older than 63 years of age and your health care provider tells you that you are at risk for this type of infection. ? Your sexual activity has changed since you were last screened, and you are at increased risk for chlamydia or gonorrhea. Ask your health care provider if you are at risk.  Ask your health care provider about whether you are at high risk for HIV. Your health care provider may recommend a prescription medicine to help prevent HIV infection. If you choose to take medicine to prevent HIV, you should first get tested for HIV. You should then be tested every 3 months for as long as you are taking the medicine. Pregnancy  If you are about to stop having your period (premenopausal) and you may become pregnant, seek counseling before you get pregnant.  Take 400 to 800 micrograms (mcg) of folic acid every day if you become pregnant.  Ask for birth control (contraception) if you want to prevent pregnancy. Osteoporosis and menopause Osteoporosis is a disease in which the bones lose minerals and strength with aging. This can result in bone fractures. If you are 69 years old or older, or if you are at risk for osteoporosis and fractures, ask your health care provider if you should:  Be screened for bone loss.  Take a calcium or vitamin D supplement to lower your risk of fractures.  Be given hormone replacement therapy (HRT) to treat symptoms of menopause. Follow these  instructions at home: Lifestyle  Do not use any products that contain nicotine or tobacco, such as cigarettes, e-cigarettes, and chewing tobacco. If you need help quitting, ask your health care provider.  Do not use street drugs.  Do not share needles.  Ask your health care provider for help if you need support or information about quitting drugs. Alcohol use  Do not drink alcohol if: ? Your health care provider tells you not to drink. ? You are pregnant, may be pregnant, or are planning to become pregnant.  If you drink alcohol: ? Limit how much you use to 0-1 drink a day. ? Limit intake if you are breastfeeding.  Be aware of how much alcohol is in your drink. In the [place] one drink equals one 12 oz bottle of beer (355 mL), one 5 oz glass of wine (148 mL), or one 1 oz glass of hard liquor (44 mL). General instructions  Schedule regular health, dental, and eye exams.  Stay current with your vaccines.  Tell your health care provider if: ? You often feel depressed. ? You have ever been abused or do not feel safe at home. Summary  Adopting a healthy lifestyle and getting preventive care are important in promoting health and wellness.  Follow your health care provider's instructions about healthy diet, exercising, and getting tested or screened for diseases.  Follow your health care provider's instructions on monitoring your cholesterol and blood pressure. This information is not intended to replace advice given to you by your health care provider. Make sure you discuss any questions you have with your health care provider. Document Revised: 10/18/2018 Document Reviewed: 10/18/2018 Elsevier Patient Education  2021 [employer]. Jolisa Intriago M.D.

## 2020-12-24 ENCOUNTER — Other Ambulatory Visit: Payer: Self-pay

## 2020-12-24 ENCOUNTER — Ambulatory Visit (INDEPENDENT_AMBULATORY_CARE_PROVIDER_SITE_OTHER): Payer: BC Managed Care – PPO | Admitting: Internal Medicine

## 2020-12-24 ENCOUNTER — Encounter: Payer: Self-pay | Admitting: Internal Medicine

## 2020-12-24 VITALS — BP 170/105 | HR 85 | Temp 98.2°F | Ht 62.0 in | Wt 136.0 lb

## 2020-12-24 DIAGNOSIS — Z79899 Other long term (current) drug therapy: Secondary | ICD-10-CM | POA: Diagnosis not present

## 2020-12-24 DIAGNOSIS — Z0000 Encounter for general adult medical examination without abnormal findings: Secondary | ICD-10-CM | POA: Diagnosis not present

## 2020-12-24 DIAGNOSIS — I10 Essential (primary) hypertension: Secondary | ICD-10-CM

## 2020-12-24 DIAGNOSIS — M545 Low back pain, unspecified: Secondary | ICD-10-CM

## 2020-12-24 DIAGNOSIS — I341 Nonrheumatic mitral (valve) prolapse: Secondary | ICD-10-CM

## 2020-12-24 DIAGNOSIS — R739 Hyperglycemia, unspecified: Secondary | ICD-10-CM

## 2020-12-24 MED ORDER — AMLODIPINE BESYLATE 5 MG PO TABS: 5.0000 mg | ORAL_TABLET | Freq: Every day | ORAL | 1 refills | Status: DC

## 2020-12-24 NOTE — Patient Instructions (Addendum)
Ensure Bp control   Goal 130/80 or below .  Increase  Amlodipine to 5 mg per day .  Send in readings  In 2 weeks   Take twice a day for  3-5 days  If not controlled  We can do fu virtual visit or other advice .  Please make appt with Dr Meda Coffee cardiology .   Right back pain seems mechanical  Plan referral to Physical therapy .  caution with  Heating pad.   Fasting labs this week    Health Maintenance, Female Adopting a healthy lifestyle and getting preventive care are important in promoting health and wellness. Ask your health care provider about:  The right schedule for you to have regular tests and exams.  Things you can do on your own to prevent diseases and keep yourself healthy. What should I know about diet, weight, and exercise? Eat a healthy diet  Eat a diet that includes plenty of vegetables, fruits, low-fat dairy products, and lean protein.  Do not eat a lot of foods that are high in solid fats, added sugars, or sodium.   Maintain a healthy weight Body mass index (BMI) is used to identify weight problems. It estimates body fat based on height and weight. Your health care provider can help determine your BMI and help you achieve or maintain a healthy weight. Get regular exercise Get regular exercise. This is one of the most important things you can do for your health. Most adults should:  Exercise for at least 150 minutes each week. The exercise should increase your heart rate and make you sweat (moderate-intensity exercise).  Do strengthening exercises at least twice a week. This is in addition to the moderate-intensity exercise.  Spend less time sitting. Even light physical activity can be beneficial. Watch cholesterol and blood lipids Have your blood tested for lipids and cholesterol at 63 years of age, then have this test every 5 years. Have your cholesterol levels checked more often if:  Your lipid or cholesterol levels are high.  You are older than 63 years of  age.  You are at high risk for heart disease. What should I know about cancer screening? Depending on your health history and family history, you may need to have cancer screening at various ages. This may include screening for:  Breast cancer.  Cervical cancer.  Colorectal cancer.  Skin cancer.  Lung cancer. What should I know about heart disease, diabetes, and high blood pressure? Blood pressure and heart disease  High blood pressure causes heart disease and increases the risk of stroke. This is more likely to develop in people who have high blood pressure readings, are of African descent, or are overweight.  Have your blood pressure checked: ? Every 3-5 years if you are 80-52 years of age. ? Every year if you are 67 years old or older. Diabetes Have regular diabetes screenings. This checks your fasting blood sugar level. Have the screening done:  Once every three years after age 49 if you are at a normal weight and have a low risk for diabetes.  More often and at a younger age if you are overweight or have a high risk for diabetes. What should I know about preventing infection? Hepatitis B If you have a higher risk for hepatitis B, you should be screened for this virus. Talk with your health care provider to find out if you are at risk for hepatitis B infection. Hepatitis C Testing is recommended for:  Everyone born from  1943 through [birth year].  Anyone with known risk factors for hepatitis C. Sexually transmitted infections (STIs)  Get screened for STIs, including gonorrhea and chlamydia, if: ? You are sexually active and are younger than 63 years of age. ? You are older than 63 years of age and your health care provider tells you that you are at risk for this type of infection. ? Your sexual activity has changed since you were last screened, and you are at increased risk for chlamydia or gonorrhea. Ask your health care provider if you are at risk.  Ask your health care  provider about whether you are at high risk for HIV. Your health care provider may recommend a prescription medicine to help prevent HIV infection. If you choose to take medicine to prevent HIV, you should first get tested for HIV. You should then be tested every 3 months for as long as you are taking the medicine. Pregnancy  If you are about to stop having your period (premenopausal) and you may become pregnant, seek counseling before you get pregnant.  Take 400 to 800 micrograms (mcg) of folic acid every day if you become pregnant.  Ask for birth control (contraception) if you want to prevent pregnancy. Osteoporosis and menopause Osteoporosis is a disease in which the bones lose minerals and strength with aging. This can result in bone fractures. If you are 28 years old or older, or if you are at risk for osteoporosis and fractures, ask your health care provider if you should:  Be screened for bone loss.  Take a calcium or vitamin D supplement to lower your risk of fractures.  Be given hormone replacement therapy (HRT) to treat symptoms of menopause. Follow these instructions at home: Lifestyle  Do not use any products that contain nicotine or tobacco, such as cigarettes, e-cigarettes, and chewing tobacco. If you need help quitting, ask your health care provider.  Do not use street drugs.  Do not share needles.  Ask your health care provider for help if you need support or information about quitting drugs. Alcohol use  Do not drink alcohol if: ? Your health care provider tells you not to drink. ? You are pregnant, may be pregnant, or are planning to become pregnant.  If you drink alcohol: ? Limit how much you use to 0-1 drink a day. ? Limit intake if you are breastfeeding.  Be aware of how much alcohol is in your drink. In the [place] one drink equals one 12 oz bottle of beer (355 mL), one 5 oz glass of wine (148 mL), or one 1 oz glass of hard liquor (44 mL). General  instructions  Schedule regular health, dental, and eye exams.  Stay current with your vaccines.  Tell your health care provider if: ? You often feel depressed. ? You have ever been abused or do not feel safe at home. Summary  Adopting a healthy lifestyle and getting preventive care are important in promoting health and wellness.  Follow your health care provider's instructions about healthy diet, exercising, and getting tested or screened for diseases.  Follow your health care provider's instructions on monitoring your cholesterol and blood pressure. This information is not intended to replace advice given to you by your health care provider. Make sure you discuss any questions you have with your health care provider. Document Revised: 10/18/2018 Document Reviewed: 10/18/2018 Elsevier Patient Education  2021 [employer].

## 2020-12-26 ENCOUNTER — Other Ambulatory Visit (INDEPENDENT_AMBULATORY_CARE_PROVIDER_SITE_OTHER): Payer: BC Managed Care – PPO | Admitting: Internal Medicine

## 2020-12-26 ENCOUNTER — Other Ambulatory Visit (INDEPENDENT_AMBULATORY_CARE_PROVIDER_SITE_OTHER): Payer: BC Managed Care – PPO

## 2020-12-26 ENCOUNTER — Other Ambulatory Visit: Payer: Self-pay

## 2020-12-26 DIAGNOSIS — I10 Essential (primary) hypertension: Secondary | ICD-10-CM | POA: Diagnosis not present

## 2020-12-26 DIAGNOSIS — M545 Low back pain, unspecified: Secondary | ICD-10-CM | POA: Diagnosis not present

## 2020-12-26 DIAGNOSIS — Z0000 Encounter for general adult medical examination without abnormal findings: Secondary | ICD-10-CM | POA: Diagnosis not present

## 2020-12-26 DIAGNOSIS — Z79899 Other long term (current) drug therapy: Secondary | ICD-10-CM | POA: Diagnosis not present

## 2020-12-26 DIAGNOSIS — R809 Proteinuria, unspecified: Secondary | ICD-10-CM | POA: Diagnosis not present

## 2020-12-26 LAB — TSH: TSH: 1.24 u[IU]/mL (ref 0.35–4.50)

## 2020-12-26 LAB — CBC WITH DIFFERENTIAL/PLATELET
Basophils Absolute: 0 10*3/uL (ref 0.0–0.1)
Basophils Relative: 0.7 % (ref 0.0–3.0)
Eosinophils Absolute: 0.1 10*3/uL (ref 0.0–0.7)
Eosinophils Relative: 1.9 % (ref 0.0–5.0)
HCT: 41.4 % (ref 36.0–46.0)
Hemoglobin: 13.9 g/dL (ref 12.0–15.0)
Lymphocytes Relative: 37.1 % (ref 12.0–46.0)
Lymphs Abs: 2.3 10*3/uL (ref 0.7–4.0)
MCHC: 33.5 g/dL (ref 30.0–36.0)
MCV: 86.9 fl (ref 78.0–100.0)
Monocytes Absolute: 0.5 10*3/uL (ref 0.1–1.0)
Monocytes Relative: 7.7 % (ref 3.0–12.0)
Neutro Abs: 3.2 10*3/uL (ref 1.4–7.7)
Neutrophils Relative %: 52.6 % (ref 43.0–77.0)
Platelets: 270 10*3/uL (ref 150.0–400.0)
RBC: 4.76 Mil/uL (ref 3.87–5.11)
RDW: 14.7 % (ref 11.5–15.5)
WBC: 6.2 10*3/uL (ref 4.0–10.5)

## 2020-12-26 LAB — HEMOGLOBIN A1C: Hgb A1c MFr Bld: 6 % (ref 4.6–6.5)

## 2020-12-26 LAB — BASIC METABOLIC PANEL
BUN: 19 mg/dL (ref 6–23)
CO2: 29 mEq/L (ref 19–32)
Calcium: 9.1 mg/dL (ref 8.4–10.5)
Chloride: 104 mEq/L (ref 96–112)
Creatinine, Ser: 0.91 mg/dL (ref 0.40–1.20)
GFR: 67.34 mL/min (ref >60.00)
Glucose, Bld: 110 mg/dL — ABNORMAL HIGH (ref 70–99)
Potassium: 4 mEq/L (ref 3.5–5.1)
Sodium: 138 mEq/L (ref 135–145)

## 2020-12-26 LAB — HEPATIC FUNCTION PANEL
ALT: 17 U/L (ref 0–35)
AST: 18 U/L (ref 0–37)
Albumin: 4.1 g/dL (ref 3.5–5.2)
Alkaline Phosphatase: 44 U/L (ref 39–117)
Bilirubin, Direct: 0.1 mg/dL (ref 0.0–0.3)
Total Bilirubin: 0.4 mg/dL (ref 0.2–1.2)
Total Protein: 6.8 g/dL (ref 6.0–8.3)

## 2020-12-26 LAB — POCT URINALYSIS DIPSTICK
Bilirubin, UA: NEGATIVE
Blood, UA: NEGATIVE
Glucose, UA: NEGATIVE
Ketones, UA: NEGATIVE
Leukocytes, UA: NEGATIVE
Nitrite, UA: NEGATIVE
Protein, UA: POSITIVE — AB
Spec Grav, UA: 1.025 (ref 1.010–1.025)
Urobilinogen, UA: 0.2 E.U./dL
pH, UA: 5 (ref 5.0–8.0)

## 2020-12-26 LAB — LIPID PANEL
Cholesterol: 203 mg/dL — ABNORMAL HIGH (ref 0–200)
HDL: 50.3 mg/dL (ref >39.00)
LDL Cholesterol: 128 mg/dL — ABNORMAL HIGH (ref 0–99)
NonHDL: 152.43
Total CHOL/HDL Ratio: 4
Triglycerides: 123 mg/dL (ref 0.0–149.0)
VLDL: 24.6 mg/dL (ref 0.0–40.0)

## 2020-12-28 NOTE — Progress Notes (Signed)
Blood sugar  in prediabetic  but improved state   nl oliver renal function   no uti  cholesterol could be better  Urine shows no infection but   screen for protein    Please order and arrange urine micro albumin/creatinine ration to  assess  if this is significant

## 2021-01-01 ENCOUNTER — Other Ambulatory Visit: Payer: Self-pay

## 2021-01-01 DIAGNOSIS — R809 Proteinuria, unspecified: Secondary | ICD-10-CM

## 2021-01-09 ENCOUNTER — Other Ambulatory Visit: Payer: Self-pay

## 2021-01-09 ENCOUNTER — Other Ambulatory Visit: Payer: BC Managed Care – PPO

## 2021-01-09 DIAGNOSIS — R809 Proteinuria, unspecified: Secondary | ICD-10-CM | POA: Diagnosis not present

## 2021-01-09 LAB — MICROALBUMIN / CREATININE URINE RATIO
Creatinine,U: 103.9 mg/dL
Microalb Creat Ratio: 0.7 mg/g (ref 0.0–30.0)
Microalb, Ur: <0.7 mg/dL (ref 0.0–1.9)

## 2021-01-11 NOTE — Progress Notes (Signed)
Urine protein is not increased     normal range .

## 2021-01-13 ENCOUNTER — Telehealth: Payer: Self-pay | Admitting: Internal Medicine

## 2021-01-13 NOTE — Telephone Encounter (Signed)
Patient is returning a call. Advised I did not see where someone called her.  She states that she left a urine sample the other day and wanted to know if it could be about that.  Please advise.

## 2021-01-13 NOTE — Telephone Encounter (Signed)
Patient informed of urine results and verbalized understanding.  ? ?

## 2021-01-22 ENCOUNTER — Ambulatory Visit: Payer: BC Managed Care – PPO

## 2021-01-26 ENCOUNTER — Other Ambulatory Visit: Payer: Self-pay | Admitting: Internal Medicine

## 2021-02-12 DIAGNOSIS — Z6824 Body mass index (BMI) 24.0-24.9, adult: Secondary | ICD-10-CM | POA: Diagnosis not present

## 2021-02-12 DIAGNOSIS — Z01419 Encounter for gynecological examination (general) (routine) without abnormal findings: Secondary | ICD-10-CM | POA: Diagnosis not present

## 2021-02-12 DIAGNOSIS — Z1231 Encounter for screening mammogram for malignant neoplasm of breast: Secondary | ICD-10-CM | POA: Diagnosis not present

## 2021-03-13 ENCOUNTER — Ambulatory Visit: Payer: BC Managed Care – PPO

## 2021-03-30 ENCOUNTER — Telehealth: Payer: Self-pay | Admitting: Internal Medicine

## 2021-03-30 MED ORDER — NADOLOL 20 MG PO TABS: ORAL_TABLET | ORAL | 0 refills | Status: DC

## 2021-03-30 NOTE — Telephone Encounter (Signed)
RX sent

## 2021-03-30 NOTE — Telephone Encounter (Signed)
Pt is calling in stating that she is almost out (week worth) of Rx nadolol (CORGARD) 20 MG and has made an virtual appointment on 04/21/2021 @ 1:30. Pharm:  [facility] in [place], [place]

## 2021-03-31 ENCOUNTER — Other Ambulatory Visit: Payer: Self-pay | Admitting: Internal Medicine

## 2021-04-20 ENCOUNTER — Other Ambulatory Visit: Payer: Self-pay | Admitting: Internal Medicine

## 2021-04-20 NOTE — Progress Notes (Signed)
Virtual Visit via Video Note  I connected withNAME@ on 04/21/21 at  1:30 PM EDT by a video enabled telemedicine application and verified that I am speaking with the correct person using two identifiers. Location patient: home Location provider:work office Persons participating in the virtual visit: patient, provider  [place] national recommendations  regarding COVID 19 pandemic   video visit is advised over in office visit for this patient.  Patient aware  of the limitations of evaluation and management by telemedicine and  availability of in person appointments. and agreed to proceed.   HPI: Amanda Patterson presents for video visit about medication and blood pressure control  BP: Is doing much better blood pressure 119/73 she is on amlodipine 5 mg and now the law half of the 20 mg a day needs refills.  Feels fine no new symptoms tries to exercise when temperature corrected BG & going back to checking please review normals and goals.   ROS: See pertinent positives and negatives per HPI.  Past Medical History:  Diagnosis Date   BP (high blood pressure)    Chicken pox    GERD (gastroesophageal reflux disease)    Headache(784.0)    High cholesterol    Hx of varicella    Murmur, heart    MVP by hx  stress echo 2012 midl post prolapse valve   Pain in the chest 10/22/2014   poss gi related  uncertain has risk factors  cont protonix cards check rov in 1-2 months    Palpitations     Past Surgical History:  Procedure Laterality Date   BIOPSY BREAST     BREAST EXCISIONAL BIOPSY Bilateral    Approx. 107 years ago   CESAREAN SECTION  [phone]   COLONOSCOPY      Family History  Problem Relation Age of Onset   Diabetes Mother    Hypertension Mother    Stroke Father    Hypertension Father    Heart disease Paternal Grandmother    Stroke Brother    Heart attack Neg Hx    Breast cancer Neg Hx    Colon polyps Neg Hx    Colon cancer Neg Hx    Esophageal cancer Neg Hx    Stomach  cancer Neg Hx    Rectal cancer Neg Hx     Social History   Tobacco Use   Smoking status: Never   Smokeless tobacco: Never  Vaping Use   Vaping Use: Never used  Substance Use Topics   Alcohol use: No   Drug use: No      Current Outpatient Medications:    amLODipine (NORVASC) 5 MG tablet, Take 1 tablet (5 mg total) by mouth daily., Disp: 90 tablet, Rfl: 1   Ascorbic Acid (VITAMIN C) 1000 MG tablet, Take 1,000 mg by mouth daily., Disp: , Rfl:    BLACK ELDERBERRY PO, Take by mouth., Disp: , Rfl:    Cholecalciferol (VITAMIN D3) 2000 UNITS TABS, Take 1 tablet by mouth daily., Disp: , Rfl:    Psyllium (METAMUCIL PO), Take 1 packet by mouth daily., Disp: , Rfl:    nadolol (CORGARD) 20 MG tablet, Take 0.5 tablets (10 mg total) by mouth daily., Disp: 45 tablet, Rfl: 2   pantoprazole (PROTONIX) 40 MG tablet, Take 1 tablet (40 mg total) by mouth daily., Disp: 30 tablet, Rfl: 3  EXAM: BP Readings from Last 3 Encounters:  12/24/20 (!) 170/105  12/02/20 110/67  05/02/20 124/80   130 range  VITALS per patient if applicable:  GENERAL: alert, oriented, appears well and in no acute distress  HEENT: atraumatic, conjunttiva clear, no obvious abnormalities on inspection of external nose and ears  NECK: normal movements of the head and neck  LUNGS: on inspection no signs of respiratory distress, breathing rate appears normal, no obvious gross SOB, gasping or wheezing  CV: no obvious cyanosis  MS: moves all visible extremities without noticeable abnormality  PSYCH/NEURO: pleasant and cooperative, no obvious depression or anxiety, speech and thought processing grossly intact Lab Results  Component Value Date   WBC 6.2 12/26/2020   HGB 13.9 12/26/2020   HCT 41.4 12/26/2020   PLT 270.0 12/26/2020   GLUCOSE 110 (H) 12/26/2020   CHOL 203 (H) 12/26/2020   TRIG 123.0 12/26/2020   HDL 50.30 12/26/2020   LDLDIRECT 145.4 08/21/2012   LDLCALC 128 (H) 12/26/2020   ALT 17 12/26/2020   AST 18  12/26/2020   NA 138 12/26/2020   K 4.0 12/26/2020   CL 104 12/26/2020   CREATININE 0.91 12/26/2020   BUN 19 12/26/2020   CO2 29 12/26/2020   TSH 1.24 12/26/2020   HGBA1C 6.0 12/26/2020   MICROALBUR <0.7 01/09/2021    ASSESSMENT AND PLAN:  Discussed the following assessment and plan:    ICD-10-CM   1. Essential hypertension  I10     2. Medication management  Z79.899     3. Hyperglycemia  R73.9     4. Hx gestational diabetes  Z86.32      Record review full panel of labs and CPX due February 2023 if blood pressure is at goal as well as blood sugar stable. Otherwise get back with [place] before if consistently out of range or not at goal. Counseled.   Expectant management and discussion of plan and treatment with opportunity to ask questions and all were answered. The patient agreed with the plan and demonstrated an understanding of the instructions.   Advised to call back or seek an in-person evaluation if worsening  or having  further concerns . Return for CPX and labs and medicines February 2023 or as needed.    Shanon Ace, MD

## 2021-04-21 ENCOUNTER — Encounter: Payer: Self-pay | Admitting: Internal Medicine

## 2021-04-21 ENCOUNTER — Telehealth (INDEPENDENT_AMBULATORY_CARE_PROVIDER_SITE_OTHER): Payer: BC Managed Care – PPO | Admitting: Internal Medicine

## 2021-04-21 VITALS — Ht 62.0 in

## 2021-04-21 DIAGNOSIS — I10 Essential (primary) hypertension: Secondary | ICD-10-CM | POA: Diagnosis not present

## 2021-04-21 DIAGNOSIS — Z79899 Other long term (current) drug therapy: Secondary | ICD-10-CM | POA: Diagnosis not present

## 2021-04-21 DIAGNOSIS — Z8632 Personal history of gestational diabetes: Secondary | ICD-10-CM | POA: Diagnosis not present

## 2021-04-21 DIAGNOSIS — R739 Hyperglycemia, unspecified: Secondary | ICD-10-CM | POA: Diagnosis not present

## 2021-04-21 MED ORDER — NADOLOL 20 MG PO TABS
10.0000 mg | ORAL_TABLET | Freq: Every day | ORAL | 2 refills | Status: AC
Start: 2021-04-21 — End: ?

## 2021-04-21 MED ORDER — PANTOPRAZOLE SODIUM 40 MG PO TBEC: 1.0000 | DELAYED_RELEASE_TABLET | Freq: Every day | ORAL | 3 refills | Status: DC

## 2021-06-15 ENCOUNTER — Telehealth: Payer: Self-pay | Admitting: Internal Medicine

## 2021-06-15 MED ORDER — AMLODIPINE BESYLATE 5 MG PO TABS: 5.0000 mg | ORAL_TABLET | Freq: Every day | ORAL | 1 refills | Status: DC

## 2021-06-15 NOTE — Telephone Encounter (Signed)
Pt call and stated she take Amlodipine '5mg'$  a day ,but was given 2.'5mg'$  and she had to take 2 a day ,so she stated she will need a refill for '5mg'$  a day sent to  [id number]- [place], [place] - [address] Phone:  [phone] Fax:  [phone]

## 2021-06-15 NOTE — Telephone Encounter (Signed)
RX sent to pts pharmacy.  

## 2021-06-16 DIAGNOSIS — N760 Acute vaginitis: Secondary | ICD-10-CM | POA: Diagnosis not present

## 2021-07-19 ENCOUNTER — Other Ambulatory Visit: Payer: Self-pay | Admitting: Internal Medicine

## 2021-07-30 ENCOUNTER — Other Ambulatory Visit: Payer: Self-pay | Admitting: Internal Medicine

## 2021-08-06 ENCOUNTER — Telehealth: Payer: Self-pay | Admitting: Internal Medicine

## 2021-08-06 NOTE — Telephone Encounter (Signed)
Error

## 2021-08-07 ENCOUNTER — Encounter: Payer: Self-pay | Admitting: Family Medicine

## 2021-08-07 ENCOUNTER — Telehealth: Payer: BC Managed Care – PPO | Admitting: Family Medicine

## 2021-08-07 VITALS — Ht 62.0 in

## 2021-08-07 DIAGNOSIS — U071 COVID-19: Secondary | ICD-10-CM | POA: Diagnosis not present

## 2021-08-07 DIAGNOSIS — R059 Cough, unspecified: Secondary | ICD-10-CM

## 2021-08-07 MED ORDER — NIRMATRELVIR/RITONAVIR (PAXLOVID)TABLET: 3.0000 | ORAL_TABLET | Freq: Two times a day (BID) | ORAL | 0 refills | Status: AC

## 2021-08-07 MED ORDER — BENZONATATE 100 MG PO CAPS
200.0000 mg | ORAL_CAPSULE | Freq: Two times a day (BID) | ORAL | 0 refills | Status: AC | PRN
Start: 2021-08-07 — End: 2021-08-17

## 2021-08-07 NOTE — Progress Notes (Signed)
Virtual Visit via Video Note I connected with Amanda Patterson on 08/07/21 by a video enabled telemedicine application and verified that I am speaking with the correct person using two identifiers.  Location patient: home Location provider:Home office Persons participating in the virtual visit: patient, provider  I discussed the limitations of evaluation and management by telemedicine and the availability of in person appointments. The patient expressed understanding and agreed to proceed.  Chief Complaint  Patient presents with   Covid Positive   HPI: Amanda Patterson is a 65 yo female with hx of HTN,mitral valve prolapse,and chronic headache c/o respiratory symptoms that started on 08/03/2020. +Chills, fatigue, headache,nasal congestion, rhinorrhea, sore throat, productive cough with clearish sputum, and body aches.  Negative for fever, stridor,anosmia,ageusia,CP, palpitations, dyspnea, hemoptysis, wheezing, changes in bowel habits, nausea, vomiting, urinary symptoms, or a skin rash.  Abdominal wall pain when having coughing spells.  She has taken Tylenol, NyQuil, and DayQuil. Symptoms have been stable. No known sick contact. COVID-19 vaccination completed.  ROS: See pertinent positives and negatives per HPI.  Past Medical History:  Diagnosis Date   BP (high blood pressure)    Chicken pox    GERD (gastroesophageal reflux disease)    Headache(784.0)    High cholesterol    Hx of varicella    Murmur, heart    MVP by hx  stress echo 2012 midl post prolapse valve   Pain in the chest 10/22/2014   poss gi related  uncertain has risk factors  cont protonix cards check rov in 1-2 months    Palpitations     Past Surgical History:  Procedure Laterality Date   BIOPSY BREAST     BREAST EXCISIONAL BIOPSY Bilateral    Approx. 32 years ago   CESAREAN SECTION  [phone]   COLONOSCOPY      Family History  Problem Relation Age of Onset   Diabetes Mother    Hypertension Mother    Stroke  Father    Hypertension Father    Heart disease Paternal Grandmother    Stroke Brother    Heart attack Neg Hx    Breast cancer Neg Hx    Colon polyps Neg Hx    Colon cancer Neg Hx    Esophageal cancer Neg Hx    Stomach cancer Neg Hx    Rectal cancer Neg Hx     Social History   Socioeconomic History   Marital status: Married    Spouse name: Not on file   Number of children: Not on file   Years of education: Not on file   Highest education level: Not on file  Occupational History   Not on file  Tobacco Use   Smoking status: Never   Smokeless tobacco: Never  Vaping Use   Vaping Use: Never used  Substance and Sexual Activity   Alcohol use: No   Drug use: No   Sexual activity: Not on file  Other Topics Concern   Not on file  Social History Narrative   7 hours of sleep per night   2 people living in the home     Considers her health good   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    Walking 5 day per week.    Now  [place]  years still has residence in [place] Strain: Not on file  Food Insecurity: Not on file  Transportation Needs: Not on file  Physical Activity: Not on file  Stress: Not on file  Social Connections: Not on file  Intimate Partner Violence: Not on file   Current Outpatient Medications:    amLODipine (NORVASC) 5 MG tablet, Take 1 tablet (5 mg total) by mouth daily., Disp: 90 tablet, Rfl: 1   Ascorbic Acid (VITAMIN C) 1000 MG tablet, Take 1,000 mg by mouth daily., Disp: , Rfl:    BLACK ELDERBERRY PO, Take by mouth., Disp: , Rfl:    Cholecalciferol (VITAMIN D3) 2000 UNITS TABS, Take 1 tablet by mouth daily., Disp: , Rfl:    nadolol (CORGARD) 20 MG tablet, Take 0.5 tablets (10 mg total) by mouth daily., Disp: 45 tablet, Rfl: 2   pantoprazole (PROTONIX) 40 MG tablet, TAKE  1 TABLET BY MOUTH EVERY DAY, Disp: 90 tablet, Rfl: 1   Psyllium (METAMUCIL PO), Take 1 packet by mouth daily., Disp: , Rfl:   EXAM:  VITALS per patient if applicable:Ht 5\' 2"  (1.575 m)   BMI 24.87 kg/m   GENERAL: alert, oriented, appears well and in no acute distress  HEENT: atraumatic, conjunctiva clear, no obvious abnormalities on inspection of external nose and ears  NECK: normal movements of the head and neck  LUNGS: on inspection no signs of respiratory distress, breathing rate appears normal, no obvious gross SOB, gasping or wheezing.  CV: no obvious cyanosis  MS: moves all visible extremities without noticeable abnormality  PSYCH/NEURO: pleasant and cooperative, no obvious depression or anxiety, speech and thought processing grossly intact  ASSESSMENT AND PLAN:  Discussed the following assessment and plan:  COVID-19 virus infection - Plan: nirmatrelvir/ritonavir EUA (PAXLOVID) 20 x 150 MG & 10 x 100MG  TABS We discussed Dx,possible complications and treatment options. She has a mild to moderate case with risk for complications. We discussed oral antiviral options and side effects. She agrees with trying Pxlovid. Symptomatic treatment with plenty of fluids,rest,tylenol 500 mg 3-4 times per day prn. Throat lozenges if needed for sore throat.  Clearly instructed about warning signs.  Cough - Plan: benzonatate (TESSALON) 100 MG capsule I do not think imaging is needed at this time. Explained that cough and congestion may last a few more days and even weeks after acute symptoms have resolved. Benzonatate for symptomatic treatment as well as adequate hydration.  The patient was advised to call back or seek an in-person evaluation if the symptoms worsen or if the condition fails to improve as anticipated. I discussed the assessment and treatment plan with the patient. The patient was provided an opportunity to ask questions and all were answered. The patient agreed with the  plan and demonstrated an understanding of the instructions.  Return if symptoms worsen or fail to improve.  Amanda Lina Martinique, MD

## 2021-09-04 DIAGNOSIS — Z03818 Encounter for observation for suspected exposure to other biological agents ruled out: Secondary | ICD-10-CM | POA: Diagnosis not present

## 2021-09-04 DIAGNOSIS — Z20822 Contact with and (suspected) exposure to covid-19: Secondary | ICD-10-CM | POA: Diagnosis not present

## 2021-09-23 DIAGNOSIS — Z0000 Encounter for general adult medical examination without abnormal findings: Secondary | ICD-10-CM | POA: Diagnosis not present

## 2021-09-23 DIAGNOSIS — Z13220 Encounter for screening for lipoid disorders: Secondary | ICD-10-CM | POA: Diagnosis not present

## 2021-09-23 DIAGNOSIS — Z1329 Encounter for screening for other suspected endocrine disorder: Secondary | ICD-10-CM | POA: Diagnosis not present

## 2021-09-23 DIAGNOSIS — Z130 Encounter for screening for diseases of the blood and blood-forming organs and certain disorders involving the immune mechanism: Secondary | ICD-10-CM | POA: Diagnosis not present

## 2021-09-24 DIAGNOSIS — R739 Hyperglycemia, unspecified: Secondary | ICD-10-CM | POA: Diagnosis not present

## 2021-09-24 DIAGNOSIS — Z1329 Encounter for screening for other suspected endocrine disorder: Secondary | ICD-10-CM | POA: Diagnosis not present

## 2021-09-24 DIAGNOSIS — Z0000 Encounter for general adult medical examination without abnormal findings: Secondary | ICD-10-CM | POA: Diagnosis not present

## 2021-09-24 DIAGNOSIS — Z13220 Encounter for screening for lipoid disorders: Secondary | ICD-10-CM | POA: Diagnosis not present

## 2021-10-14 DIAGNOSIS — H40013 Open angle with borderline findings, low risk, bilateral: Secondary | ICD-10-CM | POA: Diagnosis not present

## 2021-12-09 ENCOUNTER — Ambulatory Visit: Payer: BC Managed Care – PPO | Admitting: Cardiology

## 2021-12-31 DIAGNOSIS — H6123 Impacted cerumen, bilateral: Secondary | ICD-10-CM | POA: Diagnosis not present

## 2022-01-06 DIAGNOSIS — M542 Cervicalgia: Secondary | ICD-10-CM | POA: Diagnosis not present

## 2022-01-06 DIAGNOSIS — Z1231 Encounter for screening mammogram for malignant neoplasm of breast: Secondary | ICD-10-CM | POA: Diagnosis not present

## 2022-01-06 DIAGNOSIS — I10 Essential (primary) hypertension: Secondary | ICD-10-CM | POA: Diagnosis not present

## 2022-01-06 DIAGNOSIS — Z1211 Encounter for screening for malignant neoplasm of colon: Secondary | ICD-10-CM | POA: Diagnosis not present

## 2022-02-15 ENCOUNTER — Ambulatory Visit: Payer: BC Managed Care – PPO | Admitting: Cardiology

## 2022-02-25 ENCOUNTER — Ambulatory Visit: Payer: BC Managed Care – PPO | Admitting: Cardiology

## 2022-03-06 ENCOUNTER — Other Ambulatory Visit: Payer: Self-pay | Admitting: Internal Medicine

## 2022-03-08 NOTE — Telephone Encounter (Signed)
Refilled for 30 days. Pt due for CPE  ?

## 2022-03-16 DIAGNOSIS — I341 Nonrheumatic mitral (valve) prolapse: Secondary | ICD-10-CM | POA: Diagnosis not present

## 2022-03-16 DIAGNOSIS — R002 Palpitations: Secondary | ICD-10-CM | POA: Diagnosis not present

## 2022-03-16 DIAGNOSIS — I10 Essential (primary) hypertension: Secondary | ICD-10-CM | POA: Diagnosis not present

## 2022-03-30 DIAGNOSIS — R7309 Other abnormal glucose: Secondary | ICD-10-CM | POA: Diagnosis not present

## 2022-03-30 DIAGNOSIS — Z1211 Encounter for screening for malignant neoplasm of colon: Secondary | ICD-10-CM | POA: Diagnosis not present

## 2022-03-30 DIAGNOSIS — I10 Essential (primary) hypertension: Secondary | ICD-10-CM | POA: Diagnosis not present

## 2022-07-14 DIAGNOSIS — J069 Acute upper respiratory infection, unspecified: Secondary | ICD-10-CM | POA: Diagnosis not present

## 2022-07-14 DIAGNOSIS — I10 Essential (primary) hypertension: Secondary | ICD-10-CM | POA: Diagnosis not present

## 2022-07-14 DIAGNOSIS — I208 Other forms of angina pectoris: Secondary | ICD-10-CM | POA: Diagnosis not present

## 2022-07-15 DIAGNOSIS — R059 Cough, unspecified: Secondary | ICD-10-CM | POA: Diagnosis not present

## 2022-07-18 DIAGNOSIS — K219 Gastro-esophageal reflux disease without esophagitis: Secondary | ICD-10-CM | POA: Diagnosis not present

## 2022-07-18 DIAGNOSIS — R531 Weakness: Secondary | ICD-10-CM | POA: Diagnosis not present

## 2022-07-18 DIAGNOSIS — R1013 Epigastric pain: Secondary | ICD-10-CM | POA: Diagnosis not present

## 2022-07-18 DIAGNOSIS — R079 Chest pain, unspecified: Secondary | ICD-10-CM | POA: Diagnosis not present

## 2022-07-18 DIAGNOSIS — R12 Heartburn: Secondary | ICD-10-CM | POA: Diagnosis not present

## 2022-07-18 DIAGNOSIS — R0789 Other chest pain: Secondary | ICD-10-CM | POA: Diagnosis not present

## 2022-07-18 DIAGNOSIS — R051 Acute cough: Secondary | ICD-10-CM | POA: Diagnosis not present

## 2022-07-18 DIAGNOSIS — R059 Cough, unspecified: Secondary | ICD-10-CM | POA: Diagnosis not present

## 2022-07-22 DIAGNOSIS — I10 Essential (primary) hypertension: Secondary | ICD-10-CM | POA: Diagnosis not present

## 2022-07-22 DIAGNOSIS — K219 Gastro-esophageal reflux disease without esophagitis: Secondary | ICD-10-CM | POA: Diagnosis not present

## 2022-07-22 DIAGNOSIS — K5900 Constipation, unspecified: Secondary | ICD-10-CM | POA: Diagnosis not present

## 2022-07-28 DIAGNOSIS — Z01419 Encounter for gynecological examination (general) (routine) without abnormal findings: Secondary | ICD-10-CM | POA: Diagnosis not present

## 2022-07-28 DIAGNOSIS — Z6824 Body mass index (BMI) 24.0-24.9, adult: Secondary | ICD-10-CM | POA: Diagnosis not present

## 2022-07-28 DIAGNOSIS — Z1231 Encounter for screening mammogram for malignant neoplasm of breast: Secondary | ICD-10-CM | POA: Diagnosis not present

## 2022-09-15 DIAGNOSIS — I10 Essential (primary) hypertension: Secondary | ICD-10-CM | POA: Diagnosis not present

## 2022-09-15 DIAGNOSIS — K219 Gastro-esophageal reflux disease without esophagitis: Secondary | ICD-10-CM | POA: Diagnosis not present

## 2022-09-20 DIAGNOSIS — H25813 Combined forms of age-related cataract, bilateral: Secondary | ICD-10-CM | POA: Diagnosis not present

## 2022-09-20 DIAGNOSIS — H5203 Hypermetropia, bilateral: Secondary | ICD-10-CM | POA: Diagnosis not present

## 2022-09-20 DIAGNOSIS — H43393 Other vitreous opacities, bilateral: Secondary | ICD-10-CM | POA: Diagnosis not present

## 2022-09-20 DIAGNOSIS — H04129 Dry eye syndrome of unspecified lacrimal gland: Secondary | ICD-10-CM | POA: Diagnosis not present

## 2022-09-29 ENCOUNTER — Other Ambulatory Visit: Payer: Self-pay | Admitting: Internal Medicine

## 2022-10-06 DIAGNOSIS — Z0000 Encounter for general adult medical examination without abnormal findings: Secondary | ICD-10-CM | POA: Diagnosis not present

## 2022-10-06 DIAGNOSIS — Z1329 Encounter for screening for other suspected endocrine disorder: Secondary | ICD-10-CM | POA: Diagnosis not present

## 2022-10-06 DIAGNOSIS — E7800 Pure hypercholesterolemia, unspecified: Secondary | ICD-10-CM | POA: Diagnosis not present

## 2022-10-06 DIAGNOSIS — Z130 Encounter for screening for diseases of the blood and blood-forming organs and certain disorders involving the immune mechanism: Secondary | ICD-10-CM | POA: Diagnosis not present

## 2022-10-06 DIAGNOSIS — R7309 Other abnormal glucose: Secondary | ICD-10-CM | POA: Diagnosis not present

## 2022-10-06 DIAGNOSIS — Z79899 Other long term (current) drug therapy: Secondary | ICD-10-CM | POA: Diagnosis not present

## 2022-10-06 DIAGNOSIS — I10 Essential (primary) hypertension: Secondary | ICD-10-CM | POA: Diagnosis not present

## 2023-11-16 ENCOUNTER — Telehealth: Payer: Self-pay | Admitting: Internal Medicine

## 2023-11-16 NOTE — Telephone Encounter (Signed)
 Called pt to sch physical, left vm. Pls sch pt for appt.

## 2024-01-05 ENCOUNTER — Telehealth: Payer: Self-pay | Admitting: *Deleted

## 2024-01-05 NOTE — Telephone Encounter (Signed)
 Copied from CRM [id number]. Topic: General - Other >> Jan 05, 2024 11:48 AM Rodman Pickle T wrote: Reason for CRM: patient is needing a call back regarding the shingles vaccine    she wants to know if she need's to keep getting the vaccine

## 2024-01-06 NOTE — Telephone Encounter (Signed)
 Spoke to pt. Inform her she had 2 shingle vaccines on 03/09/2018 and 11/26/2019.   She should be good on the vaccine.   Scheduled pt a physical visit on 02/09/2024.

## 2024-02-08 NOTE — Progress Notes (Deleted)
 No chief complaint on file.   HPI: Patient  Amanda Patterson  67 y.o. comes in today for Preventive Health Care visit  Last visit with me was video in 6 2022  Last pv was 2 22  Since then has seen other providers regarding ht  hld  [facility]   PV 11 2022 and 11 23 pd 11 24  with labs  and med rx  [place]   I guess establishing back in [place]   Health Maintenance  Topic Date Due   Pneumonia Vaccine 9+ Years old (1 of 2 - PCV) Never done   MAMMOGRAM  12/06/2021   DEXA SCAN  Never done   COVID-19 Vaccine (7 - 2024-25 season) 07/10/2023   DTaP/Tdap/Td (2 - Td or Tdap) 02/28/2024   INFLUENZA VACCINE  06/08/2024   Colonoscopy  12/03/2027   Hepatitis C Screening  Completed   Zoster Vaccines- Shingrix  Completed   HPV VACCINES  Aged Out   Health Maintenance Review LIFESTYLE:  Exercise:   Tobacco/ETS: Alcohol:  Sugar beverages: Sleep: Drug use: no HH of  Work:    ROS:  GEN/ HEENT: No fever, significant weight changes sweats headaches vision problems hearing changes, CV/ PULM; No chest pain shortness of breath cough, syncope,edema  change in exercise tolerance. GI /GU: No adominal pain, vomiting, change in bowel habits. No blood in the stool. No significant GU symptoms. SKIN/HEME: ,no acute skin rashes suspicious lesions or bleeding. No lymphadenopathy, nodules, masses.  NEURO/ PSYCH:  No neurologic signs such as weakness numbness. No depression anxiety. IMM/ Allergy: No unusual infections.  Allergy .   REST of 12 system review negative except as per HPI   Past Medical History:  Diagnosis Date   BP (high blood pressure)    Chicken pox    GERD (gastroesophageal reflux disease)    Headache(784.0)    High cholesterol    Hx of varicella    Murmur, heart    MVP by hx  stress echo 2012 midl post prolapse valve   Pain in the chest 10/22/2014   poss gi related  uncertain has risk factors  cont protonix cards check rov in 1-2 months    Palpitations      Past Surgical History:  Procedure Laterality Date   BIOPSY BREAST     BREAST EXCISIONAL BIOPSY Bilateral    Approx. 15 years ago   CESAREAN SECTION  [phone]   COLONOSCOPY      Family History  Problem Relation Age of Onset   Diabetes Mother    Hypertension Mother    Stroke Father    Hypertension Father    Heart disease Paternal Grandmother    Stroke Brother    Heart attack Neg Hx    Breast cancer Neg Hx    Colon polyps Neg Hx    Colon cancer Neg Hx    Esophageal cancer Neg Hx    Stomach cancer Neg Hx    Rectal cancer Neg Hx     Social History   Socioeconomic History   Marital status: Married    Spouse name: Not on file   Number of children: Not on file   Years of education: Not on file   Highest education level: Not on file  Occupational History   Not on file  Tobacco Use   Smoking status: Never   Smokeless tobacco: Never  Vaping Use   Vaping status: Never Used  Substance and Sexual Activity   Alcohol  use: No   Drug use: No   Sexual activity: Not on file  Other Topics Concern   Not on file  Social History Narrative   7 hours of sleep per night   2 people living in the home     Considers her health good   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    Walking 5 day per week.    Now  [place] for a few years still has residence in [place]            Social Drivers of Health   Financial Resource Strain: Low Risk  (10/04/2023)   Received from [facility]   Overall Financial Resource Strain (CARDIA)    Difficulty of Paying Living Expenses: Not hard at all  Food Insecurity: No Food Insecurity (10/04/2023)   [facility] from [facility]   Hunger Vital Sign    Worried About Running Out of Food in the Last Year: Never true    Ran Out of Food in the Last Year: Never true  Transportation Needs: No Transportation Needs (10/04/2023)   Received  from [facility] - Transportation    Lack of Transportation (Medical): No    Lack of Transportation (Non-Medical): No  Physical Activity: Insufficiently Active (10/04/2023)   Received from [facility]   Exercise Vital Sign    Days of Exercise per Week: 2 days    Minutes of Exercise per Session: 30 min  Stress: No Stress Concern Present (10/04/2023)   Received from [facility] - Occupational Stress Questionnaire    Feeling of Stress : Not at all  Social Connections: Socially Integrated (10/04/2023)   Received from [facility]   Social Network    How would you rate your social network (family, work, friends)?: Good participation with social networks    Outpatient Medications Prior to Visit  Medication Sig Dispense Refill   amLODipine (NORVASC) 5 MG tablet TAKE 1 TABLET (5 MG TOTAL) BY MOUTH DAILY. 30 tablet 0   Ascorbic Acid (VITAMIN C) 1000 MG tablet Take 1,000 mg by mouth daily.     BLACK ELDERBERRY PO Take by mouth.     Cholecalciferol (VITAMIN D3) 2000 UNITS TABS Take 1 tablet by mouth daily.     nadolol (CORGARD) 20 MG tablet Take 0.5 tablets (10 mg total) by mouth daily. 45 tablet 2   pantoprazole (PROTONIX) 40 MG tablet TAKE 1 TABLET BY MOUTH EVERY DAY 30 tablet 0   Psyllium (METAMUCIL PO) Take 1 packet by mouth daily.     No facility-administered medications prior to visit.     EXAM:  There were no vitals taken for this visit.  There is no height or weight on file to calculate BMI. Wt Readings from Last 3 Encounters:  12/24/20 136 lb (61.7 kg)  12/02/20 130 lb (59 kg)  11/18/20 130 lb (59 kg)    Physical Exam: Vital signs reviewed NAT:FTDD is a well-developed well-nourished alert cooperative    who appearsr stated age in no acute distress.  HEENT: normocephalic atraumatic , Eyes: PERRL EOM's full, conjunctiva clear, Nares: paten,t no deformity discharge or tenderness., Ears: no deformity EAC's clear TMs  with normal landmarks. Mouth: clear OP, no lesions, edema.  Moist mucous membranes.  Dentition in adequate repair. NECK: supple without masses, thyromegaly or bruits. CHEST/PULM:  Clear to auscultation and percussion breath sounds equal no wheeze , rales or rhonchi. No chest wall deformities or tenderness. Breast: normal by inspection . No dimpling, discharge, masses, tenderness or discharge . CV: PMI is nondisplaced, S1 S2 no gallops, murmurs, rubs. Peripheral pulses are full without delay.No JVD .  ABDOMEN: Bowel sounds normal nontender  No guard or rebound, no hepato splenomegal no CVA tenderness.  No hernia. Extremtities:  No clubbing cyanosis or edema, no acute joint swelling or redness no focal atrophy NEURO:  Oriented x3, cranial nerves 3-12 appear to be intact, no obvious focal weakness,gait within normal limits no abnormal reflexes or asymmetrical SKIN: No acute rashes normal turgor, color, no bruising or petechiae. PSYCH: Oriented, good eye contact, no obvious depression anxiety, cognition and judgment appear normal. LN: no cervical axillary inguinal adenopathy  Lab Results  Component Value Date   WBC 6.2 12/26/2020   HGB 13.9 12/26/2020   HCT 41.4 12/26/2020   PLT 270.0 12/26/2020   GLUCOSE 110 (H) 12/26/2020   CHOL 203 (H) 12/26/2020   TRIG 123.0 12/26/2020   HDL 50.30 12/26/2020   LDLDIRECT 145.4 08/21/2012   LDLCALC 128 (H) 12/26/2020   ALT 17 12/26/2020   AST 18 12/26/2020   NA 138 12/26/2020   K 4.0 12/26/2020   CL 104 12/26/2020   CREATININE 0.91 12/26/2020   BUN 19 12/26/2020   CO2 29 12/26/2020   TSH 1.24 12/26/2020   HGBA1C 6.0 12/26/2020   MICROALBUR <0.7 01/09/2021    BP Readings from Last 3 Encounters:  12/24/20 (!) 170/105  12/02/20 110/67  05/02/20 124/80    Lab results reviewed with patient   ASSESSMENT AND PLAN:  Discussed the following assessment and plan:    ICD-10-CM   1. Visit for preventive health examination  Z00.00     2.  Hyperglycemia  R73.9     3. Hypertension, unspecified type  I10     4. Hx gestational diabetes  Z86.32     5. Essential hypertension  I10     6. Medication management  Z79.899      No follow-ups on file.  Patient Care Team: Minal Stuller, Neta Mends, MD as PCP - General (Internal Medicine) Richardean Chimera, MD (Obstetrics and Gynecology) Nathanial Rancher, DC as Physician Assistant (Chiropractic Medicine) Lars Masson, MD as Consulting Physician (Cardiology) There are no Patient Instructions on file for this visit.  Neta Mends. Kiarah Eckstein M.D.

## 2024-02-09 ENCOUNTER — Encounter: Payer: BC Managed Care – PPO | Admitting: Internal Medicine

## 2024-02-09 ENCOUNTER — Telehealth: Payer: Self-pay

## 2024-02-09 DIAGNOSIS — Z79899 Other long term (current) drug therapy: Secondary | ICD-10-CM

## 2024-02-09 DIAGNOSIS — I10 Essential (primary) hypertension: Secondary | ICD-10-CM

## 2024-02-09 DIAGNOSIS — R739 Hyperglycemia, unspecified: Secondary | ICD-10-CM

## 2024-02-09 DIAGNOSIS — Z0000 Encounter for general adult medical examination without abnormal findings: Secondary | ICD-10-CM

## 2024-02-09 DIAGNOSIS — Z8632 Personal history of gestational diabetes: Secondary | ICD-10-CM

## 2024-02-09 NOTE — Telephone Encounter (Signed)
 Spoke to pt regarding to appt and primary care.    Pt reports she had called yesterday to cancel her appt. Pt shares she is in [place] and found a primary care there. Pt would like to know how get her records to there.   Inform pt she can sign a medical record from [place] or [facility] to get her records fax over to [facility].   Pt verbalized understanding.   Forwarding to Dr. Fabian Sharp for [facility]

## 2024-02-12 NOTE — Telephone Encounter (Signed)
 Ok wishing her  best .
